# Patient Record
Sex: Male | Born: 1970 | Race: White | Hispanic: No | Smoking: Former smoker
Health system: Southern US, Community
[De-identification: ages and names within clinical notes are randomized; demographics above are authoritative.]

## PROBLEM LIST (undated history)

## (undated) DIAGNOSIS — F329 Major depressive disorder, single episode, unspecified: Secondary | ICD-10-CM

## (undated) DIAGNOSIS — Z87442 Personal history of urinary calculi: Secondary | ICD-10-CM

## (undated) DIAGNOSIS — K219 Gastro-esophageal reflux disease without esophagitis: Secondary | ICD-10-CM

## (undated) DIAGNOSIS — M1990 Unspecified osteoarthritis, unspecified site: Secondary | ICD-10-CM

## (undated) DIAGNOSIS — F32A Depression, unspecified: Secondary | ICD-10-CM

## (undated) DIAGNOSIS — K279 Peptic ulcer, site unspecified, unspecified as acute or chronic, without hemorrhage or perforation: Secondary | ICD-10-CM

## (undated) DIAGNOSIS — G43909 Migraine, unspecified, not intractable, without status migrainosus: Secondary | ICD-10-CM

## (undated) DIAGNOSIS — K589 Irritable bowel syndrome without diarrhea: Secondary | ICD-10-CM

## (undated) DIAGNOSIS — M940 Chondrocostal junction syndrome [Tietze]: Secondary | ICD-10-CM

## (undated) DIAGNOSIS — E785 Hyperlipidemia, unspecified: Secondary | ICD-10-CM

## (undated) DIAGNOSIS — F4310 Post-traumatic stress disorder, unspecified: Secondary | ICD-10-CM

## (undated) DIAGNOSIS — K861 Other chronic pancreatitis: Secondary | ICD-10-CM

## (undated) HISTORY — DX: Migraine, unspecified, not intractable, without status migrainosus: G43.909

## (undated) HISTORY — PX: APPENDECTOMY: SHX54

## (undated) HISTORY — PX: CHOLECYSTECTOMY: SHX55

---
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
## 1998-07-15 HISTORY — PX: KNEE ARTHROSCOPY: SHX127

## 2000-05-08 ENCOUNTER — Emergency Department (HOSPITAL_COMMUNITY): Admission: EM | Admit: 2000-05-08 | Discharge: 2000-05-08 | Payer: Self-pay | Admitting: Emergency Medicine

## 2004-08-05 ENCOUNTER — Emergency Department (HOSPITAL_COMMUNITY): Admission: EM | Admit: 2004-08-05 | Discharge: 2004-08-05 | Payer: Self-pay | Admitting: Emergency Medicine

## 2006-01-29 ENCOUNTER — Encounter: Admission: RE | Admit: 2006-01-29 | Discharge: 2006-01-29 | Payer: Self-pay | Admitting: Gastroenterology

## 2006-01-31 ENCOUNTER — Ambulatory Visit (HOSPITAL_COMMUNITY): Admission: RE | Admit: 2006-01-31 | Discharge: 2006-01-31 | Payer: Self-pay | Admitting: Gastroenterology

## 2006-05-21 ENCOUNTER — Encounter (INDEPENDENT_AMBULATORY_CARE_PROVIDER_SITE_OTHER): Payer: Self-pay | Admitting: Specialist

## 2006-05-21 ENCOUNTER — Inpatient Hospital Stay (HOSPITAL_COMMUNITY): Admission: EM | Admit: 2006-05-21 | Discharge: 2006-05-22 | Payer: Self-pay | Admitting: *Deleted

## 2006-08-19 ENCOUNTER — Emergency Department (HOSPITAL_COMMUNITY): Admission: EM | Admit: 2006-08-19 | Discharge: 2006-08-20 | Payer: Self-pay | Admitting: *Deleted

## 2006-08-26 ENCOUNTER — Ambulatory Visit (HOSPITAL_COMMUNITY): Admission: RE | Admit: 2006-08-26 | Discharge: 2006-08-26 | Payer: Self-pay | Admitting: Internal Medicine

## 2007-03-10 ENCOUNTER — Emergency Department (HOSPITAL_COMMUNITY): Admission: EM | Admit: 2007-03-10 | Discharge: 2007-03-10 | Payer: Self-pay | Admitting: Emergency Medicine
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
## 2009-02-11 ENCOUNTER — Emergency Department (HOSPITAL_COMMUNITY): Admission: EM | Admit: 2009-02-11 | Discharge: 2009-02-11 | Payer: Self-pay | Admitting: Emergency Medicine

## 2009-04-03 ENCOUNTER — Emergency Department (HOSPITAL_COMMUNITY): Admission: EM | Admit: 2009-04-03 | Discharge: 2009-04-03 | Payer: Self-pay | Admitting: Emergency Medicine

## 2009-04-09 ENCOUNTER — Emergency Department (HOSPITAL_COMMUNITY): Admission: EM | Admit: 2009-04-09 | Discharge: 2009-04-09 | Payer: Self-pay | Admitting: Emergency Medicine

## 2009-06-21 ENCOUNTER — Emergency Department (HOSPITAL_COMMUNITY): Admission: EM | Admit: 2009-06-21 | Discharge: 2009-06-21 | Payer: Self-pay | Admitting: Emergency Medicine

## 2009-11-30 ENCOUNTER — Emergency Department (HOSPITAL_COMMUNITY): Admission: EM | Admit: 2009-11-30 | Discharge: 2009-11-30 | Payer: Self-pay | Admitting: Emergency Medicine

## 2010-07-15 DIAGNOSIS — K589 Irritable bowel syndrome without diarrhea: Secondary | ICD-10-CM

## 2010-07-15 HISTORY — DX: Irritable bowel syndrome without diarrhea: K58.9

## 2010-08-05 ENCOUNTER — Encounter: Payer: Self-pay | Admitting: Gastroenterology

## 2010-11-14 ENCOUNTER — Emergency Department (HOSPITAL_COMMUNITY): Payer: Self-pay

## 2010-11-14 ENCOUNTER — Emergency Department (HOSPITAL_COMMUNITY)
Admission: EM | Admit: 2010-11-14 | Discharge: 2010-11-14 | Disposition: A | Discharge status: Home / Self Care | Payer: Self-pay | Attending: Emergency Medicine | Admitting: Emergency Medicine

## 2010-11-14 DIAGNOSIS — R1013 Epigastric pain: Secondary | ICD-10-CM | POA: Insufficient documentation

## 2010-11-14 DIAGNOSIS — R197 Diarrhea, unspecified: Secondary | ICD-10-CM | POA: Insufficient documentation

## 2010-11-14 DIAGNOSIS — Z791 Long term (current) use of non-steroidal anti-inflammatories (NSAID): Secondary | ICD-10-CM | POA: Insufficient documentation

## 2010-11-14 DIAGNOSIS — K625 Hemorrhage of anus and rectum: Secondary | ICD-10-CM | POA: Insufficient documentation

## 2010-11-14 DIAGNOSIS — R10815 Periumbilic abdominal tenderness: Secondary | ICD-10-CM | POA: Insufficient documentation

## 2010-11-14 DIAGNOSIS — G43909 Migraine, unspecified, not intractable, without status migrainosus: Secondary | ICD-10-CM | POA: Insufficient documentation

## 2010-11-14 DIAGNOSIS — R110 Nausea: Secondary | ICD-10-CM | POA: Insufficient documentation

## 2010-11-14 DIAGNOSIS — Z9089 Acquired absence of other organs: Secondary | ICD-10-CM | POA: Insufficient documentation

## 2010-11-14 LAB — BASIC METABOLIC PANEL
CO2: 19 mEq/L (ref 19–32)
Calcium: 9.4 mg/dL (ref 8.4–10.5)
Creatinine, Ser: 0.97 mg/dL (ref 0.4–1.5)
GFR calc Af Amer: >60 mL/min (ref >60)
GFR calc non Af Amer: >60 mL/min (ref >60)

## 2010-11-14 LAB — DIFFERENTIAL
Basophils Relative: 0 % (ref 0–1)
Eosinophils Absolute: 0 10*3/uL (ref 0.0–0.7)
Eosinophils Relative: 1 % (ref 0–5)
Monocytes Absolute: 0.7 10*3/uL (ref 0.1–1.0)
Monocytes Relative: 9 % (ref 3–12)

## 2010-11-14 LAB — CBC
Hemoglobin: 16.9 g/dL (ref 13.0–17.0)
MCH: 29.1 pg (ref 26.0–34.0)
MCHC: 35.9 g/dL (ref 30.0–36.0)
Platelets: 255 10*3/uL (ref 150–400)
RDW: 12.9 % (ref 11.5–15.5)

## 2010-11-14 MED ORDER — IOHEXOL 300 MG/ML  SOLN
100.0000 mL | Freq: Once | INTRAMUSCULAR | Status: AC | PRN
  Administered 2010-11-14: 100 mL via INTRAVENOUS

## 2010-11-22 ENCOUNTER — Other Ambulatory Visit: Payer: Self-pay | Admitting: Gastroenterology
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
## 2010-11-30 NOTE — Discharge Summary (Signed)
NAMEARRICK, DUTTON               ACCOUNT NO.:  [id number]   MEDICAL RECORD NO.:  [id number]          PATIENT TYPE:  INP   LOCATION:  5733                         FACILITY:  [facility]   PHYSICIAN:  Sandria Bales. Ezzard Standing, M.D.  DATE OF BIRTH:  April 01, 1971   DATE OF ADMISSION:  05/21/2006  DATE OF DISCHARGE:  05/22/2006                               DISCHARGE SUMMARY   DISCHARGE DIAGNOSIS:  Acute supparative appendicitis.   OPERATION PERFORMED:  Laparoscopic appendectomy.   INDICATIONS FOR PROCEDURE:  Mr. Chiou is a 40 year old white male seen  at [facility] who developed abdominal pain the evening before  presentation.  He presented with the signs and symptoms of acute  appendicitis, with a white blood count of 17,100.   He was taken to the operating room where he underwent a laparoscopic  appendectomy without incident.   His final pathology showed acute suppurative appendicitis.   He was discharged home on November8, 2007.  He was given Vicodin for  pain.  He can shower.  He was to see me back to in 2 to 3 weeks for  followup.  Call for any interval problem.      Sandria Bales. Ezzard Standing, M.D.  Electronically Signed     DHN/MEDQ  D:  07/24/2006  T:  07/24/2006  Job:  [id number]

## 2010-11-30 NOTE — Op Note (Signed)
NAMERAEF, SPRIGG               ACCOUNT NO.:  [id number]   MEDICAL RECORD NO.:  [id number]          PATIENT TYPE:  INP   LOCATION:  5733                         FACILITY:  [facility]   PHYSICIAN:  Sandria Bales. Ezzard Standing, M.D.  DATE OF BIRTH:  Jul 15, 1971   DATE OF PROCEDURE:  DATE OF DISCHARGE:                                 OPERATIVE REPORT   PREOPERATIVE DIAGNOSIS:  Appendicitis.   POSTOPERATIVE DIAGNOSIS:  Acute appendicitis.   PROCEDURE:  Laparoscopic appendectomy.   SURGEON:  Sandria Bales. Ezzard Standing, M.D.   ASSISTANT:  None.   ANESTHESIA:  General endotracheal.   ESTIMATED BLOOD LOSS:  Minimal.   INDICATIONS FOR PROCEDURE:  The patient is a 40 year old white male who is  taken care of through [facility] but does not know the name of his specific  physician he sees there.  Presented with an approximate 8-hour history of  abdominal pain which is localized to the right lower quadrant.  Has a white  blood count of 17,000 and signs and symptoms consistent with acute  appendicitis.   I discussed with him about proceeding with appendectomy.  I can usually do  this laparoscopically about 80% of the time, but if not I would have to do  an open surgery.  I also discussed the risk that it would not be  appendicitis but another diagnosis such as diverticular disease, etc.  I  have discussed the risks of open surgery, bowel resection, colostomy,  infections.   OPERATIVE NOTE:  The patient was placed in the supine position.  He was  given a gram of cefoxitin at the initiation of the procedure.  His left arm  was tucked, his right arm out to the side.  His Foley catheter in place.  His abdomen was shaved.  Prepped with Betadine solution and sterilely  draped.   I accessed his abdominal cavity through an infraumbilical incision with  sharp dissection.  I placed a 10-mm Hasson trocar through a 0-degree, 10-mm  laparoscope through a 12-mm Hasson trocar.  I secured this with a 0-Vicryl  suture.  I  placed a 5-mm trocar in the right upper quadrant, a 10-mm trocar  in the left lower quadrant.   Abdominal exploration revealed right and left lobes of the liver  unremarkable.  Omentum and bowel that I could see was unremarkable.  The  right lower quadrant had an acutely inflamed purulent appearing appendix  consistent with acute appendicitis.  I took down the mesentery of the  appendix using harmonic scalpel.  For the base of the appendix, I then used  a 45 endo-GIA vascular staple of the Ethicon variety and stapled across the  base of the appendix.  I placed the appendix in an EndoCatch bag and  delivered it through the umbilicus.   I then irrigated the abdomen with about 500 mL of saline.  The staple line  looked good.  There was no bleeding from the mesentery of the appendix.  I  then removed the trocars under direct visualization.  There was no bleeding  at any  trocar site.  The umbilical  port was closed with a 0-Vicryl suture.  The  skin injury site was closed with a 5-0 Vicryl suture, painted with a  tincture of benzoin, and Steri-Stripped.  The patient tolerated the  procedure well.  Sponge and instrument counts were correct at the end of the  case.      Sandria Bales. Ezzard Standing, M.D.  Electronically Signed     DHN/MEDQ  D:  05/21/2006  T:  05/21/2006  Job:  [id number]

## 2011-03-02 ENCOUNTER — Emergency Department (HOSPITAL_COMMUNITY)
Admission: EM | Admit: 2011-03-02 | Discharge: 2011-03-02 | Disposition: A | Discharge status: Home / Self Care | Payer: Self-pay | Attending: Emergency Medicine | Admitting: Emergency Medicine

## 2011-03-02 DIAGNOSIS — R21 Rash and other nonspecific skin eruption: Secondary | ICD-10-CM | POA: Insufficient documentation

## 2012-12-15 ENCOUNTER — Ambulatory Visit: Payer: Self-pay | Admitting: Family Medicine

## 2012-12-15 VITALS — BP 124/72 | HR 66 | Temp 98.3°F | Resp 18 | Ht 71.5 in | Wt 210.0 lb

## 2012-12-15 DIAGNOSIS — G43919 Migraine, unspecified, intractable, without status migrainosus: Secondary | ICD-10-CM

## 2012-12-15 DIAGNOSIS — G43909 Migraine, unspecified, not intractable, without status migrainosus: Secondary | ICD-10-CM

## 2012-12-15 MED ORDER — PROMETHAZINE HCL 25 MG PO TABS: 25.0000 mg | ORAL_TABLET | Freq: Three times a day (TID) | ORAL | Status: DC | PRN

## 2012-12-15 MED ORDER — SUMATRIPTAN SUCCINATE 100 MG PO TABS: 100.0000 mg | ORAL_TABLET | Freq: Every day | ORAL | Status: DC | PRN

## 2012-12-15 NOTE — Patient Instructions (Addendum)
Use the imitrex as needed for migraine, and the phenergan as needed for pain and nausea.  If you do not feel better today please call me or come back in- Sooner if worse.   If this headache seems to be different or worse than your typical migraine please let me know right away

## 2012-12-15 NOTE — Progress Notes (Signed)
[facility] [address] [place] [address] [phone]  Date:  12/15/2012   Name:  Frank Soto   DOB:  11-10-1970   MRN:  [id number]  PCP:  No primary provider on file.    Chief Complaint: No chief complaint on file.   History of Present Illness:  Frank Soto is a 42 y.o. very pleasant male patient who presents with the following:  Here today as a new patient.  He has had migraine HA's since the age of 42 years old per his report.   He is here with a current HA- has been present since early this am.  he gets up at 3am.  A couple of hours later he noted onset of the HA. He took some ibuprofen, and the HA came on as the morning progressed.  He now has a typical migraine HA for him.  He states he last got a migraine HA about 2 months ago.    They tend to occur about once a month.   He had been taking imitrex, but stopped as this no longer seemed to be helping.  However, he might like to try this again Most recently went to Dr. Jearl Klinefelter office and was treated with phenergan according to pharmacy records.   He has noted nausea but no vomiting.  He has photophobia and phonophobia.  He does tend to get aura.   He is otherwise generally healthy  There are no active problems to display for this patient.   Past Medical History  Diagnosis Date  . Migraine     Past Surgical History  Procedure Laterality Date  . Appendectomy    . Cholecystectomy      History  Substance Use Topics  . Smoking status: Never Smoker   . Smokeless tobacco: Not on file  . Alcohol Use: No    Family History  Problem Relation Age of Onset  . COPD Mother     No Known Allergies  Medication list has been reviewed and updated.  No current outpatient prescriptions on file prior to visit.   No current facility-administered medications on file prior to visit.    Review of Systems:  As per HPI- otherwise negative. He states this is a typical migraine HA for  him  Physical Examination: Filed Vitals:   12/15/12 0951  BP: 124/72  Pulse: 66  Temp: 98.3 F (36.8 C)  Resp: 18   Filed Vitals:   12/15/12 0951  Height: 5' 11.5" (1.816 m)  Weight: 210 lb (95.255 kg)   Body mass index is 28.88 kg/(m^2). Ideal Body Weight: Weight in (lb) to have BMI = 25: 181.4  GEN: WDWN, NAD, Non-toxic, A & O x 3 HEENT: Atraumatic, Normocephalic. Neck supple. No masses, No LAD.  PEERL, EOMI Ears and Nose: No external deformity. CV: RRR, No M/G/R. No JVD. No thrill. No extra heart sounds. PULM: CTA B, no wheezes, crackles, rhonchi. No retractions. No resp. distress. No accessory muscle use. ABD: S, NT, ND EXTR: No c/c/e NEURO Normal gait.  Normal UE and LE strength and sensation, normal DTR.   PSYCH: Normally interactive. Conversant. Not depressed or anxious appearing.  Calm demeanor.    Assessment and Plan: Migraine headache - Plan: SUMAtriptan (IMITREX) 100 MG tablet, promethazine (PHENERGAN) 25 MG tablet  Typical migraine HA for his today.  Refilled his imitrex and phenergan.   Advised him to try his medication and rest.  If he does not feel better as  the day goes on he is to call or RTC.    Signed Abbe Amsterdam, MD

## 2013-09-23 ENCOUNTER — Observation Stay (HOSPITAL_COMMUNITY)
Admission: EM | Admit: 2013-09-23 | Discharge: 2013-09-27 | Disposition: A | Discharge status: Home / Self Care | Payer: Self-pay | Attending: Emergency Medicine | Admitting: Emergency Medicine

## 2013-09-23 ENCOUNTER — Encounter (HOSPITAL_COMMUNITY): Payer: Self-pay | Admitting: Emergency Medicine

## 2013-09-23 ENCOUNTER — Emergency Department (HOSPITAL_COMMUNITY): Payer: Self-pay

## 2013-09-23 ENCOUNTER — Other Ambulatory Visit: Payer: Self-pay

## 2013-09-23 DIAGNOSIS — G43909 Migraine, unspecified, not intractable, without status migrainosus: Secondary | ICD-10-CM | POA: Insufficient documentation

## 2013-09-23 DIAGNOSIS — Z79899 Other long term (current) drug therapy: Secondary | ICD-10-CM | POA: Insufficient documentation

## 2013-09-23 DIAGNOSIS — J9819 Other pulmonary collapse: Secondary | ICD-10-CM

## 2013-09-23 DIAGNOSIS — R0602 Shortness of breath: Principal | ICD-10-CM | POA: Insufficient documentation

## 2013-09-23 DIAGNOSIS — J9811 Atelectasis: Secondary | ICD-10-CM

## 2013-09-23 DIAGNOSIS — Z87891 Personal history of nicotine dependence: Secondary | ICD-10-CM | POA: Insufficient documentation

## 2013-09-23 DIAGNOSIS — R55 Syncope and collapse: Secondary | ICD-10-CM

## 2013-09-23 DIAGNOSIS — R0789 Other chest pain: Secondary | ICD-10-CM | POA: Insufficient documentation

## 2013-09-23 DIAGNOSIS — R05 Cough: Secondary | ICD-10-CM | POA: Insufficient documentation

## 2013-09-23 DIAGNOSIS — E785 Hyperlipidemia, unspecified: Secondary | ICD-10-CM | POA: Diagnosis present

## 2013-09-23 DIAGNOSIS — R079 Chest pain, unspecified: Secondary | ICD-10-CM

## 2013-09-23 DIAGNOSIS — R059 Cough, unspecified: Secondary | ICD-10-CM | POA: Insufficient documentation

## 2013-09-23 LAB — CBC
HEMATOCRIT: 46.9 % (ref 39.0–52.0)
HEMOGLOBIN: 17.1 g/dL — AB (ref 13.0–17.0)
MCH: 29.6 pg (ref 26.0–34.0)
MCHC: 36.5 g/dL — AB (ref 30.0–36.0)
MCV: 81.3 fL (ref 78.0–100.0)
Platelets: 279 10*3/uL (ref 150–400)
RBC: 5.77 MIL/uL (ref 4.22–5.81)
RDW: 12.8 % (ref 11.5–15.5)
WBC: 9.9 10*3/uL (ref 4.0–10.5)

## 2013-09-23 LAB — I-STAT TROPONIN, ED: TROPONIN I, POC: 0 ng/mL (ref 0.00–0.08)

## 2013-09-23 LAB — COMPREHENSIVE METABOLIC PANEL
ALK PHOS: 104 U/L (ref 39–117)
ALT: 30 U/L (ref 0–53)
AST: 26 U/L (ref 0–37)
Albumin: 4 g/dL (ref 3.5–5.2)
BILIRUBIN TOTAL: 0.4 mg/dL (ref 0.3–1.2)
BUN: 18 mg/dL (ref 6–23)
CO2: 24 meq/L (ref 19–32)
Calcium: 9.8 mg/dL (ref 8.4–10.5)
Chloride: 100 mEq/L (ref 96–112)
Creatinine, Ser: 1.06 mg/dL (ref 0.50–1.35)
GFR, EST NON AFRICAN AMERICAN: 84 mL/min — AB (ref >90)
GLUCOSE: 97 mg/dL (ref 70–99)
POTASSIUM: 4.5 meq/L (ref 3.7–5.3)
SODIUM: 139 meq/L (ref 137–147)
TOTAL PROTEIN: 7.4 g/dL (ref 6.0–8.3)

## 2013-09-23 LAB — TROPONIN I: Troponin I: <0.3 ng/mL (ref <0.30)

## 2013-09-23 LAB — D-DIMER, QUANTITATIVE: D-Dimer, Quant: <0.27 ug/mL-FEU (ref 0.00–0.48)

## 2013-09-23 MED ORDER — MORPHINE SULFATE 2 MG/ML IJ SOLN
2.0000 mg | INTRAMUSCULAR | Status: DC | PRN
  Filled 2013-09-23: qty 1

## 2013-09-23 MED ORDER — GI COCKTAIL ~~LOC~~: 30.0000 mL | Freq: Four times a day (QID) | ORAL | Status: DC | PRN

## 2013-09-23 MED ORDER — HYDROMORPHONE HCL PF 1 MG/ML IJ SOLN
0.5000 mg | INTRAMUSCULAR | Status: DC | PRN
  Administered 2013-09-24: 1 mg via INTRAVENOUS
  Administered 2013-09-24: 0.5 mg via INTRAVENOUS
  Administered 2013-09-24: 1 mg via INTRAVENOUS
  Filled 2013-09-23 (×2): qty 1

## 2013-09-23 MED ORDER — IOHEXOL 350 MG/ML SOLN
100.0000 mL | Freq: Once | INTRAVENOUS | Status: AC | PRN
  Administered 2013-09-23: 100 mL via INTRAVENOUS

## 2013-09-23 MED ORDER — NITROGLYCERIN 2 % TD OINT
0.5000 [in_us] | TOPICAL_OINTMENT | Freq: Four times a day (QID) | TRANSDERMAL | Status: DC
  Administered 2013-09-24 (×2): 0.5 [in_us] via TOPICAL
  Filled 2013-09-23: qty 30

## 2013-09-23 MED ORDER — NITROGLYCERIN 0.4 MG SL SUBL: 0.4000 mg | SUBLINGUAL_TABLET | SUBLINGUAL | Status: DC | PRN

## 2013-09-23 MED ORDER — ASPIRIN EC 325 MG PO TBEC
325.0000 mg | DELAYED_RELEASE_TABLET | Freq: Every day | ORAL | Status: DC
  Administered 2013-09-24 – 2013-09-26 (×3): 325 mg via ORAL
  Filled 2013-09-23 (×3): qty 1

## 2013-09-23 MED ORDER — MORPHINE SULFATE 2 MG/ML IJ SOLN
2.0000 mg | Freq: Once | INTRAMUSCULAR | Status: AC
Start: 2013-09-23 — End: 2013-09-23
  Administered 2013-09-23: 2 mg via INTRAVENOUS

## 2013-09-23 MED ORDER — ENOXAPARIN SODIUM 40 MG/0.4ML ~~LOC~~ SOLN
40.0000 mg | SUBCUTANEOUS | Status: DC
  Administered 2013-09-23 – 2013-09-26 (×4): 40 mg via SUBCUTANEOUS
  Filled 2013-09-23 (×5): qty 0.4

## 2013-09-23 MED ORDER — ONDANSETRON HCL 4 MG/2ML IJ SOLN
4.0000 mg | Freq: Once | INTRAMUSCULAR | Status: AC
  Administered 2013-09-23: 4 mg via INTRAVENOUS
  Filled 2013-09-23: qty 2

## 2013-09-23 MED ORDER — MORPHINE SULFATE 4 MG/ML IJ SOLN
4.0000 mg | Freq: Once | INTRAMUSCULAR | Status: AC
  Administered 2013-09-23: 4 mg via INTRAVENOUS
  Filled 2013-09-23: qty 1

## 2013-09-23 MED ORDER — NITROPRUSSIDE SODIUM 25 MG/ML IV SOLN
0.2500 ug/kg/min | INTRAVENOUS | Status: DC
  Filled 2013-09-23: qty 2

## 2013-09-23 MED ORDER — LIDOCAINE 5 % EX PTCH
1.0000 | MEDICATED_PATCH | CUTANEOUS | Status: DC
  Administered 2013-09-23 – 2013-09-27 (×4): 1 via TRANSDERMAL
  Filled 2013-09-23 (×5): qty 1

## 2013-09-23 NOTE — ED Notes (Signed)
Dr Allena KatzPatel at bedside to assess patient Notified of patient on going CP.

## 2013-09-23 NOTE — ED Provider Notes (Signed)
CSN: [id number]     Arrival date & time 09/23/13  1637 History   First MD Initiated Contact with Patient 09/23/13 1727     Chief Complaint  Patient presents with  . Chest Pain  . Shortness of Breath     (Consider location/radiation/quality/duration/timing/severity/associated sxs/prior Treatment) Patient is a 43 y.o. male presenting with chest pain and shortness of breath. The history is provided by the patient.  Chest Pain Associated symptoms: cough and shortness of breath   Associated symptoms: no diaphoresis   Shortness of Breath Associated symptoms: chest pain and cough   Associated symptoms: no diaphoresis     Patient here today for chest pain and SOB that started earlier this morning when he was walking across a parking lot. Patient described chest pain as a sharp sensation to his left upper chest, nonradiating, pleuritic, worsening with taking deep breath or cough. Endorse shortness of breath even with mild exertion. Patient states the pain was present all day, but had alleviated some until he picked up a 35 lb pallet at work around 3 PM, at which time the chest pain returned in a worse state than the morning. The pain and SOB has gradually worsened since that time.He states he has never had pain like this before. Admits to cough and nausea that started today with the chest pain. No cardiac history, no hypertension, no hyperlipidemia. Patient has history of rib and sternum fractures and cholecystectomy and appendectomy, otherwise healthy. Denies vomiting, diaphoresis, dysuria, hematochezia, bowel habit changes, and recent illness. No recent surgery, prolonged travel (although he does travels by car a few hours each way on regular basis for work), history of cancer, history of prior PE or DVT, unilateral leg swelling or calf pain. No hemoptysis. No fever or chills. No prior cardiac workup, and no prior stress tests or heart catheterization.  Past Medical History  Diagnosis Date  .  Migraine    Past Surgical History  Procedure Laterality Date  . Appendectomy    . Cholecystectomy     Family History  Problem Relation Age of Onset  . COPD Mother    History  Substance Use Topics  . Smoking status: Former Games developer  . Smokeless tobacco: Not on file  . Alcohol Use: No    Review of Systems  Constitutional: Negative for diaphoresis.  Respiratory: Positive for cough and shortness of breath.   Cardiovascular: Positive for chest pain.  Gastrointestinal: Negative for blood in stool.      Allergies  Review of patient's allergies indicates no known allergies.  Home Medications   Current Outpatient Rx  Name  Route  Sig  Dispense  Refill  . promethazine (PHENERGAN) 25 MG tablet   Oral   Take 1 tablet (25 mg total) by mouth every 8 (eight) hours as needed for nausea. Use for nausea related to migraine HA.   20 tablet   0   . SUMAtriptan (IMITREX) 100 MG tablet   Oral   Take 1 tablet (100 mg total) by mouth daily as needed for migraine.   10 tablet   0    BP 113/86  Pulse 88  Temp(Src) 97.7 F (36.5 C) (Oral)  Resp 22  Ht 6\' 1"  (1.854 m)  Wt 205 lb (92.987 kg)  BMI 27.05 kg/m2  SpO2 97% Physical Exam  Constitutional: He appears well-developed and well-nourished. No distress.  Neck: No JVD present.  Cardiovascular: Normal rate, regular rhythm and normal heart sounds.   Pulmonary/Chest: Effort normal and breath sounds  normal. He has no wheezes. He has no rales. He exhibits tenderness (Diffuse anterior chest wall tenderness on palpation without crepitus, emphysema, or paradoxical chest movement. ).  Musculoskeletal: He exhibits no edema.  Neurological: He is alert.  Skin: Skin is warm and dry. He is not diaphoretic.  Psychiatric: He has a normal mood and affect.    ED Course  Procedures (including critical care time)  6:41 PM\ Patient complain of chest pain and shortness of breath, acute onset. He does not have any significant risk factor for PE.  He has a TIMI score 0, HEART score 0.  Pain is pleuritic, and reproducible on exam. Pain medication and antinausea medication given. Low suspicion for aortic dissection, no obvious signs of infection. No significant trauma is suggestive of rib fractures or internal injury.  Given that pt c/o DOE, and does report short car travel on a regular basis, will obtain D-Dimer.  His CXR is without pna.  Care discussed with Dr. Deretha EmoryZackowski  7:49 PM When she patient is laying in bed his saturation is at 97-98% on room air, however as soon as he ambulates a short distance his sats dropped down to 90% on room air and he was having trouble breathing with ambulation. Given the change of his respiratory status, will obtain chest CT angiogram to rule out PE. Patient will needs admission for further management of his condition. Care discussed with her, provider who will admit patient as appropriate.   Labs Review Labs Reviewed  CBC - Abnormal; Notable for the following:    Hemoglobin 17.1 (*)    MCHC 36.5 (*)    All other components within normal limits  COMPREHENSIVE METABOLIC PANEL - Abnormal; Notable for the following:    GFR calc non Af Amer 84 (*)    All other components within normal limits  D-DIMER, QUANTITATIVE  I-STAT TROPOININ, ED   Imaging Review Dg Chest 2 View  09/23/2013   CLINICAL DATA:  Chest pain  EXAM: CHEST  2 VIEW  COMPARISON:  04/09/2009  FINDINGS: The heart size and mediastinal contours are within normal limits. Both lungs are clear. The visualized skeletal structures are unremarkable.  IMPRESSION: No acute abnormality noted.   Electronically Signed   By: Alcide CleverMark  Lukens M.D.   On: 09/23/2013 17:56     EKG Interpretation None      Date: 09/23/2013  Rate: 84  Rhythm: normal sinus rhythm with sinus arrhythmia  QRS Axis: left  Intervals: normal  ST/T Wave abnormalities: normal  Conduction Disutrbances:none  Narrative Interpretation:   Old EKG Reviewed: unchanged    MDM   Final  diagnoses:  Shortness of breath    BP 132/71  Pulse 99  Temp(Src) 97.6 F (36.4 C) (Oral)  Resp 12  Ht 6' (1.829 m)  Wt 198 lb 14.4 oz (90.22 kg)  BMI 26.97 kg/m2  SpO2 98%  I have reviewed nursing notes and vital signs. I personally reviewed the imaging tests through PACS system  I reviewed available ER/hospitalization records thought the EMR     Fayrene HelperBowie Gayla Benn, PA-C 09/24/13 1505

## 2013-09-23 NOTE — H&P (Signed)
[facility] History and Physical  Patient: Frank Soto  ZOX:[id number]  DOB: June 28, 1971  DOS: the patient was seen and examined on 09/23/2013 PCP: No primary provider on file.  Chief Complaint: Chest pain  HPI: Frank Soto is a 43 y.o. male with Past medical history of migraine. The patient is coming from home. The patient presented with complaints of chest pain. He mentions he was walking across the parking lot today at which time he started having sharp left-sided chest pain which was not radiating felt like stabbing and worsening with breathing associated with dry cough. After that he continues to have shortness of breath on exertion and some mild chest pain. He lift heavy weight and after that he started having further worsening of his chest pain and shortness of breath. At that time he also had an episode of nearly passing out dizziness without any vertigo. He denies any similar symptoms in the past he denies any prior coronary work up. He denies any significant family history of coronary artery disease, recent travel, immobilization, orthopnea, PND, fever, chills. He denies any alcohol abuse, drug abuse  Review of Systems: as mentioned in the history of present illness.  A Comprehensive review of the other systems is negative.  Past Medical History  Diagnosis Date  . Migraine   . Kidney stones    Past Surgical History  Procedure Laterality Date  . Appendectomy    . Cholecystectomy     Social History:  reports that he has quit smoking. He does not have any smokeless tobacco history on file. He reports that he does not drink alcohol or use illicit drugs. Independent for most of his  ADL.  No Known Allergies  Family History  Problem Relation Age of Onset  . COPD Mother     Prior to Admission medications   Medication Sig Start Date End Date Taking? Authorizing Provider  ibuprofen (ADVIL,MOTRIN) 200 MG tablet Take 800 mg by mouth every 6 (six) hours as needed for  headache or mild pain.   Yes Historical Provider, MD  promethazine (PHENERGAN) 25 MG tablet Take 25 mg by mouth every 6 (six) hours as needed for nausea or vomiting.   Yes Historical Provider, MD  SUMAtriptan (IMITREX) 100 MG tablet Take 100 mg by mouth every 2 (two) hours as needed for migraine or headache. May repeat in 2 hours if headache persists or recurs.   Yes Historical Provider, MD    Physical Exam: Filed Vitals:   09/23/13 1917 09/23/13 1930 09/23/13 2000 09/23/13 2130  BP: 128/82 123/82 122/80 118/73  Pulse: 92 76 74 82  Temp:      TempSrc:      Resp: 8 22 13 22   Height:      Weight:      SpO2: 99% 93% 98% 97%    General: Alert, Awake and Oriented to Time, Place and Person. Appear in mild distress Eyes: PERRL ENT: Oral Mucosa clear moist. Neck: no JVD Cardiovascular: S1 and S2 Present, no Murmur, Peripheral Pulses Present Respiratory: Bilateral Air entry equal and Decreased, Clear to Auscultation,  no Crackles,no wheezes Abdomen: Bowel Sound Present, Soft and Non tender Skin: no Rash Extremities: no Pedal edema, no calf tenderness Neurologic: Grossly Unremarkable. Labs on Admission:  CBC:  Recent Labs Lab 09/23/13 1648  WBC 9.9  HGB 17.1*  HCT 46.9  MCV 81.3  PLT 279    CMP     Component Value Date/Time   NA 139 09/23/2013 1648  K 4.5 09/23/2013 1648   CL 100 09/23/2013 1648   CO2 24 09/23/2013 1648   GLUCOSE 97 09/23/2013 1648   BUN 18 09/23/2013 1648   CREATININE 1.06 09/23/2013 1648   CALCIUM 9.8 09/23/2013 1648   PROT 7.4 09/23/2013 1648   ALBUMIN 4.0 09/23/2013 1648   AST 26 09/23/2013 1648   ALT 30 09/23/2013 1648   ALKPHOS 104 09/23/2013 1648   BILITOT 0.4 09/23/2013 1648   GFRNONAA 84* 09/23/2013 1648   GFRAA >90 09/23/2013 1648    No results found for this basename: LIPASE, AMYLASE,  in the last 168 hours No results found for this basename: AMMONIA,  in the last 168 hours  No results found for this basename: CKTOTAL, CKMB, CKMBINDEX, TROPONINI,   in the last 168 hours BNP (last 3 results) No results found for this basename: PROBNP,  in the last 8760 hours  Radiological Exams on Admission: Dg Chest 2 View  09/23/2013   CLINICAL DATA:  Chest pain  EXAM: CHEST  2 VIEW  COMPARISON:  04/09/2009  FINDINGS: The heart size and mediastinal contours are within normal limits. Both lungs are clear. The visualized skeletal structures are unremarkable.  IMPRESSION: No acute abnormality noted.   Electronically Signed   By: Alcide Clever M.D.   On: 09/23/2013 17:56   Ct Angio Chest Pe W/cm &/or Wo Cm  09/23/2013   CLINICAL DATA:  Short of breath.  Chest pain.  EXAM: CT ANGIOGRAPHY CHEST WITH CONTRAST  TECHNIQUE: Multidetector CT imaging of the chest was performed using the standard protocol during bolus administration of intravenous contrast. Multiplanar CT image reconstructions and MIPs were obtained to evaluate the vascular anatomy.  CONTRAST:  OMNIPAQUE IOHEXOL 350 MG/ML SOLN  COMPARISON:  Current chest radiograph.  FINDINGS: No evidence of a pulmonary embolus.  Heart is normal in size and configuration. Great vessels are normal in caliber. No aortic dissection there is a borderline enlarged left para carinal lymph node, most likely reactive. No other prominent or enlarged lymph nodes. No mediastinal or hilar masses. The  Mild lower lung zone and dependent subsegmental atelectasis. Lungs are otherwise clear. No pleural effusion. No pneumothorax.  There is mild deformity of the sternum consistent with an old, healed fracture. No other bony abnormality.  Review of the MIP images confirms the above findings.  IMPRESSION: 1. No evidence of a pulmonary embolus. 2. No acute findings. Mild dependent and lower lung zone subsegmental atelectasis.   Electronically Signed   By: Amie Portland M.D.   On: 09/23/2013 20:29    EKG: Independently reviewed. normal EKG, normal sinus rhythm, nonspecific ST and T waves changes.  Assessment/Plan Principal Problem:   Chest  pain Active Problems:   Shortness of breath   Atelectasis   Pre-syncope   1. Chest pain The patient is presenting with complaints of chest pain. His initial EKG and troponin levels are negative for any acute ischemia. He has undergone a CT scan of his chest which was negative for any pulmonary embolism or significant aortic disruption. At the time of my evaluation he continues to have chest pain without any significant EKG changes and a negative troponin. But the patient become symptomatic with dizziness and hypoxia while ambulating here in the ED. With this the patient will be admitted to the hospital for further workup for observation. Most likely his chest pain is secondary to atelectasis and that would also explain his shortness of breath and hypoxia. He will be monitored on telemetry, I  would follow serial troponins, I would a limited echocardiogram in the morning he did I would start him on incentive spirometry q. one hours. Dilaudid for pain Nitroglycerin as needed for chest pain. N.p.o. after midnight  2. History of migraine No headache at present Last use of sumatriptan in 3 weeks ago Continue to monitor   DVT Prophylaxis: subcutaneous Heparin Nutrition: N.p.o. after midnight  Code Status: Full  Disposition: Admitted to observation in telemetry unit.  Author: Lynden OxfordPranav Claretta Kendra, MD [facility] Pager: [phone] 09/23/2013, 10:13 PM    If 7PM-7AM, please contact night-coverage [email or web address] Password [email or web address]

## 2013-09-23 NOTE — ED Provider Notes (Signed)
2045 - Patient care assumed from Fayrene HelperBowie Tran, PA-C at shift change. Patient is a 43 year old male with a history of hyperlipidemia. He is also a former smoker. He presents today for left-sided chest pain, dyspnea on exertion, and shortness of breath while at rest. Chest pain appreciated to be pleuritic in nature. Heart Score 2. Patient found to be symptomatic with ambulation but oxygen saturations dropping from 98% to 90%. Patient also was very symptomatic while ambulating. CT angio ordered as hx of prolonged travel daily and DOE concerning for PE. CT angio negative for PE today. Plan discussed with Ardelle Parkran, PA-C at shift change includes admission to hospital for further work up and evaluation of symptoms, especially in light of hypoxia while ambulating. Patient has no PCP.  2130 - Dr. Allena KatzPatel to admit. Temp admit orders placed.   Results for orders placed during the hospital encounter of 09/23/13  CBC      Result Value Ref Range   WBC 9.9  4.0 - 10.5 K/uL   RBC 5.77  4.22 - 5.81 MIL/uL   Hemoglobin 17.1 (*) 13.0 - 17.0 g/dL   HCT 19.146.9  47.839.0 - 29.552.0 %   MCV 81.3  78.0 - 100.0 fL   MCH 29.6  26.0 - 34.0 pg   MCHC 36.5 (*) 30.0 - 36.0 g/dL   RDW 62.112.8  30.811.5 - 65.715.5 %   Platelets 279  150 - 400 K/uL  COMPREHENSIVE METABOLIC PANEL      Result Value Ref Range   Sodium 139  137 - 147 mEq/L   Potassium 4.5  3.7 - 5.3 mEq/L   Chloride 100  96 - 112 mEq/L   CO2 24  19 - 32 mEq/L   Glucose, Bld 97  70 - 99 mg/dL   BUN 18  6 - 23 mg/dL   Creatinine, Ser 8.461.06  0.50 - 1.35 mg/dL   Calcium 9.8  8.4 - 96.210.5 mg/dL   Total Protein 7.4  6.0 - 8.3 g/dL   Albumin 4.0  3.5 - 5.2 g/dL   AST 26  0 - 37 U/L   ALT 30  0 - 53 U/L   Alkaline Phosphatase 104  39 - 117 U/L   Total Bilirubin 0.4  0.3 - 1.2 mg/dL   GFR calc non Af Amer 84 (*) >90 mL/min   GFR calc Af Amer >90  >90 mL/min  I-STAT TROPOININ, ED      Result Value Ref Range   Troponin i, poc 0.00  0.00 - 0.08 ng/mL   Comment 3            Dg Chest 2  View  09/23/2013   CLINICAL DATA:  Chest pain  EXAM: CHEST  2 VIEW  COMPARISON:  04/09/2009  FINDINGS: The heart size and mediastinal contours are within normal limits. Both lungs are clear. The visualized skeletal structures are unremarkable.  IMPRESSION: No acute abnormality noted.   Electronically Signed   By: Alcide CleverMark  Lukens M.D.   On: 09/23/2013 17:56   Ct Angio Chest Pe W/cm &/or Wo Cm  09/23/2013   CLINICAL DATA:  Short of breath.  Chest pain.  EXAM: CT ANGIOGRAPHY CHEST WITH CONTRAST  TECHNIQUE: Multidetector CT imaging of the chest was performed using the standard protocol during bolus administration of intravenous contrast. Multiplanar CT image reconstructions and MIPs were obtained to evaluate the vascular anatomy.  CONTRAST:  100mL OMNIPAQUE IOHEXOL 350 MG/ML SOLN  COMPARISON:  Current chest radiograph.  FINDINGS: No evidence of a  pulmonary embolus.  Heart is normal in size and configuration. Great vessels are normal in caliber. No aortic dissection there is a borderline enlarged left para carinal lymph node, most likely reactive. No other prominent or enlarged lymph nodes. No mediastinal or hilar masses. The  Mild lower lung zone and dependent subsegmental atelectasis. Lungs are otherwise clear. No pleural effusion. No pneumothorax.  There is mild deformity of the sternum consistent with an old, healed fracture. No other bony abnormality.  Review of the MIP images confirms the above findings.  IMPRESSION: 1. No evidence of a pulmonary embolus. 2. No acute findings. Mild dependent and lower lung zone subsegmental atelectasis.   Electronically Signed   By: Amie Portland M.D.   On: 09/23/2013 20:29      Antony Madura, PA-C 09/23/13 2131

## 2013-09-23 NOTE — ED Notes (Signed)
Patient transported to CT 

## 2013-09-23 NOTE — ED Notes (Signed)
Patient transported to X-ray 

## 2013-09-23 NOTE — ED Notes (Addendum)
Ambulated on the hallway oxygen saturation before ambulation was 96%/RA, during ambulation oxygen saturation dropped to 90%/RA with patient complain of dizzness and SOB. Pt started wheezing and complain of CP at  8/10 on scale of 1-10.

## 2013-09-23 NOTE — ED Notes (Signed)
Pt c/o L sided chest pain that radiates to epigastric area.  Pain started at 0730 while walking across the parking lot.  Pain diminished and pt continued working.  But at 1500 pt lifted a 35 lb pile of wood and the pain intensified. PT became nausated and sob.

## 2013-09-24 ENCOUNTER — Observation Stay (HOSPITAL_COMMUNITY): Payer: Self-pay

## 2013-09-24 ENCOUNTER — Observation Stay (HOSPITAL_COMMUNITY): Payer: MEDICAID

## 2013-09-24 DIAGNOSIS — R079 Chest pain, unspecified: Secondary | ICD-10-CM

## 2013-09-24 DIAGNOSIS — R072 Precordial pain: Secondary | ICD-10-CM

## 2013-09-24 LAB — CBC WITH DIFFERENTIAL/PLATELET
BASOS ABS: 0 10*3/uL (ref 0.0–0.1)
Basophils Relative: 0 % (ref 0–1)
Eosinophils Absolute: 0.1 10*3/uL (ref 0.0–0.7)
Eosinophils Relative: 1 % (ref 0–5)
HCT: 43.7 % (ref 39.0–52.0)
Hemoglobin: 15.6 g/dL (ref 13.0–17.0)
LYMPHS PCT: 19 % (ref 12–46)
Lymphs Abs: 2.8 10*3/uL (ref 0.7–4.0)
MCH: 29.5 pg (ref 26.0–34.0)
MCHC: 35.7 g/dL (ref 30.0–36.0)
MCV: 82.6 fL (ref 78.0–100.0)
Monocytes Absolute: 1 10*3/uL (ref 0.1–1.0)
Monocytes Relative: 7 % (ref 3–12)
NEUTROS ABS: 10.6 10*3/uL — AB (ref 1.7–7.7)
Neutrophils Relative %: 74 % (ref 43–77)
PLATELETS: 265 10*3/uL (ref 150–400)
RBC: 5.29 MIL/uL (ref 4.22–5.81)
RDW: 12.9 % (ref 11.5–15.5)
WBC: 14.4 10*3/uL — AB (ref 4.0–10.5)

## 2013-09-24 LAB — COMPREHENSIVE METABOLIC PANEL
ALT: 24 U/L (ref 0–53)
AST: 17 U/L (ref 0–37)
Albumin: 3.4 g/dL — ABNORMAL LOW (ref 3.5–5.2)
Alkaline Phosphatase: 92 U/L (ref 39–117)
BILIRUBIN TOTAL: 0.4 mg/dL (ref 0.3–1.2)
BUN: 18 mg/dL (ref 6–23)
CALCIUM: 9.1 mg/dL (ref 8.4–10.5)
CHLORIDE: 99 meq/L (ref 96–112)
CO2: 25 meq/L (ref 19–32)
Creatinine, Ser: 1.17 mg/dL (ref 0.50–1.35)
GFR calc Af Amer: 86 mL/min — ABNORMAL LOW (ref >90)
GFR, EST NON AFRICAN AMERICAN: 74 mL/min — AB (ref >90)
Glucose, Bld: 139 mg/dL — ABNORMAL HIGH (ref 70–99)
Potassium: 4.7 mEq/L (ref 3.7–5.3)
SODIUM: 139 meq/L (ref 137–147)
Total Protein: 6.4 g/dL (ref 6.0–8.3)

## 2013-09-24 LAB — PROTIME-INR
INR: 1.04 (ref 0.00–1.49)
Prothrombin Time: 13.4 seconds (ref 11.6–15.2)

## 2013-09-24 LAB — TROPONIN I
Troponin I: <0.3 ng/mL (ref <0.30)
Troponin I: <0.3 ng/mL (ref <0.30)

## 2013-09-24 MED ORDER — HYDROMORPHONE HCL PF 1 MG/ML IJ SOLN
INTRAMUSCULAR | Status: AC
  Administered 2013-09-24: 1 mg via INTRAVENOUS
  Filled 2013-09-24: qty 1

## 2013-09-24 MED ORDER — MORPHINE SULFATE 4 MG/ML IJ SOLN
4.0000 mg | Freq: Once | INTRAMUSCULAR | Status: AC
Start: 2013-09-24 — End: 2013-09-24
  Administered 2013-09-24: 4 mg via INTRAVENOUS
  Filled 2013-09-24: qty 1

## 2013-09-24 MED ORDER — REGADENOSON 0.4 MG/5ML IV SOLN
0.4000 mg | Freq: Once | INTRAVENOUS | Status: AC
Start: 2013-09-24 — End: 2013-09-24
  Administered 2013-09-24: 0.4 mg via INTRAVENOUS

## 2013-09-24 MED ORDER — TECHNETIUM TC 99M SESTAMIBI GENERIC - CARDIOLITE
10.0000 | Freq: Once | INTRAVENOUS | Status: AC | PRN
  Administered 2013-09-24: 10 via INTRAVENOUS

## 2013-09-24 MED ORDER — ONDANSETRON HCL 4 MG/2ML IJ SOLN
INTRAMUSCULAR | Status: AC
  Filled 2013-09-24: qty 2

## 2013-09-24 MED ORDER — ONDANSETRON HCL 4 MG/2ML IJ SOLN
4.0000 mg | Freq: Four times a day (QID) | INTRAMUSCULAR | Status: DC | PRN
  Administered 2013-09-24 (×3): 4 mg via INTRAVENOUS
  Filled 2013-09-24 (×2): qty 2

## 2013-09-24 MED ORDER — SODIUM CHLORIDE 0.9 % IJ SOLN
80.0000 mg | INTRAVENOUS | Status: AC
  Administered 2013-09-24: 80 mg via INTRAVENOUS
  Filled 2013-09-24: qty 3.2

## 2013-09-24 MED ORDER — TECHNETIUM TC 99M SESTAMIBI GENERIC - CARDIOLITE
30.0000 | Freq: Once | INTRAVENOUS | Status: AC | PRN
  Administered 2013-09-24: 30 via INTRAVENOUS

## 2013-09-24 MED ORDER — REGADENOSON 0.4 MG/5ML IV SOLN
INTRAVENOUS | Status: AC
  Filled 2013-09-24: qty 5

## 2013-09-24 NOTE — Progress Notes (Signed)
[facility] PROGRESS NOTE  Frank Soto ZOX:[id number] DOB: 29-Mar-1971 DOA: 09/23/2013 PCP: No primary provider on file.  Assessment/Plan:  Chest pain - Troponin x3 negative -Echocardiogram unrevealing for patient's continued chest pain -Spoke with Dr. Elease Hashimoto (cardiologist) who had a cancellation for a nuclear medicine stress test, and agreed to allow patient to have this spot. Nuclear medicine stress test pending  Migraine headache -Secondary to nitroglycerin. DC nitroglycerin -Explained to patient sumatriptan contraindicated in patients with possible cardiac ischemia, will treat with morphine and cold compresses     Code Status: Full Family Communication: None Disposition Plan: Resolution of chest pain/completion of workup   Consultants:   Procedures: Nuclear meds stress test pending  Echocardiogram 09/24/2013 - Left ventricle: The cavity size was normal. Wall thickness was normal. T -LVEF=  65%. Wall motion was normal; there were no regional wall motion abnormalities. - Right ventricle: The cavity size was normal. Systolic function was normal.   CT angiogram chest PE protocol 09/23/2013 1. No evidence of a pulmonary embolus.  2. No acute findings. Mild dependent and lower lung zone  subsegmental atelectasis.  CXR 09/23/2013 No acute abnormality noted.   Antibiotics:    HPI/Subjective: Frank Soto is a 43 y.o WM PMHx migraine. HA, Hx exertional chest pain x 2 years has not sought treatment until this admission Patient states he went to work today picked up a piece of wood weighing approximately 30 pounds began to have substernal CP, positive DOE, positive nausea. States but down wood however chest pain did not resolve. Throat himself to [facility] cone at which time had been having approximately 20 minutes of continuous substernal CP.  State was walking across the parking lot today at which time CP increased in intensity, positive radiation across whole  chest. Described as stabbing and worsening with breathing associated with dry cough. After that he continues to have shortness of breath on exertion and some mild chest pain.  At that time he also had an episode of nearly passing out dizziness without any vertigo. He denies any significant family history of coronary artery disease, recent travel, immobilization, orthopnea, PND, fever, chills.  He denies any alcohol abuse, drug abuse. 3/13 Currently complaining of migraine headache secondary to nitro paste. Also states having CP (patient just returned from echocardiogram). Positive mild SOB, radiation across chest. Rated 7/10   Objective: Filed Vitals:   09/23/13 2132 09/23/13 2230 09/23/13 2249 09/24/13 0533  BP:  120/66 116/64 110/66  Pulse:  76 78 88  Temp:   97.7 F (36.5 C) 97.6 F (36.4 C)  TempSrc:   Oral Oral  Resp:  11 12   Height:   6' (1.829 m)   Weight:   90.22 kg (198 lb 14.4 oz) 90.22 kg (198 lb 14.4 oz)  SpO2: 95% 94% 97% 98%    Intake/Output Summary (Last 24 hours) at 09/24/13 1156 Last data filed at 09/24/13 0554  Gross per 24 hour  Intake    222 ml  Output      0 ml  Net    222 ml   Filed Weights   09/23/13 1644 09/23/13 2249 09/24/13 0533  Weight: 92.987 kg (205 lb) 90.22 kg (198 lb 14.4 oz) 90.22 kg (198 lb 14.4 oz)    Exam:   General:  A./O. x4, moderate distress secondary to migraine and CP  Cardiovascular: Regular rhythm and rate, negative murmurs rubs gallops  Respiratory: Clear to auscultation bilateral  Abdomen: Soft, nontender, nondistended, plus bowel sound  Musculoskeletal:  Negative pedal edema   Data Reviewed: Basic Metabolic Panel:  Recent Labs Lab 09/23/13 1648 09/24/13 0406  NA 139 139  K 4.5 4.7  CL 100 99  CO2 24 25  GLUCOSE 97 139*  BUN 18 18  CREATININE 1.06 1.17  CALCIUM 9.8 9.1   Liver Function Tests:  Recent Labs Lab 09/23/13 1648 09/24/13 0406  AST 26 17  ALT 30 24  ALKPHOS 104 92  BILITOT 0.4 0.4  PROT 7.4  6.4  ALBUMIN 4.0 3.4*   No results found for this basename: LIPASE, AMYLASE,  in the last 168 hours No results found for this basename: AMMONIA,  in the last 168 hours CBC:  Recent Labs Lab 09/23/13 1648 09/24/13 0406  WBC 9.9 14.4*  NEUTROABS  --  10.6*  HGB 17.1* 15.6  HCT 46.9 43.7  MCV 81.3 82.6  PLT 279 265   Cardiac Enzymes:  Recent Labs Lab 09/23/13 2130 09/24/13 0406 09/24/13 0900  TROPONINI <0.30 <0.30 <0.30   BNP (last 3 results) No results found for this basename: PROBNP,  in the last 8760 hours CBG: No results found for this basename: GLUCAP,  in the last 168 hours  No results found for this or any previous visit (from the past 240 hour(s)).   Studies: Dg Chest 2 View  09/23/2013   CLINICAL DATA:  Chest pain  EXAM: CHEST  2 VIEW  COMPARISON:  04/09/2009  FINDINGS: The heart size and mediastinal contours are within normal limits. Both lungs are clear. The visualized skeletal structures are unremarkable.  IMPRESSION: No acute abnormality noted.   Electronically Signed   By: Alcide Clever M.D.   On: 09/23/2013 17:56   Ct Angio Chest Pe W/cm &/or Wo Cm  09/23/2013   CLINICAL DATA:  Short of breath.  Chest pain.  EXAM: CT ANGIOGRAPHY CHEST WITH CONTRAST  TECHNIQUE: Multidetector CT imaging of the chest was performed using the standard protocol during bolus administration of intravenous contrast. Multiplanar CT image reconstructions and MIPs were obtained to evaluate the vascular anatomy.  CONTRAST:  OMNIPAQUE IOHEXOL 350 MG/ML SOLN  COMPARISON:  Current chest radiograph.  FINDINGS: No evidence of a pulmonary embolus.  Heart is normal in size and configuration. Great vessels are normal in caliber. No aortic dissection there is a borderline enlarged left para carinal lymph node, most likely reactive. No other prominent or enlarged lymph nodes. No mediastinal or hilar masses. The  Mild lower lung zone and dependent subsegmental atelectasis. Lungs are otherwise clear.  No pleural effusion. No pneumothorax.  There is mild deformity of the sternum consistent with an old, healed fracture. No other bony abnormality.  Review of the MIP images confirms the above findings.  IMPRESSION: 1. No evidence of a pulmonary embolus. 2. No acute findings. Mild dependent and lower lung zone subsegmental atelectasis.   Electronically Signed   By: Amie Portland M.D.   On: 09/23/2013 20:29    Scheduled Meds: . aspirin EC  325 mg Oral Daily  . enoxaparin (LOVENOX) injection  40 mg Subcutaneous Q24H  . lidocaine  1 patch Transdermal Q24H  . nitroGLYCERIN  0.5 inch Topical 4 times per day   Continuous Infusions:   Principal Problem:   Chest pain Active Problems:   Migraine   Shortness of breath   Atelectasis   Pre-syncope    Time spent: 45 minute   WOODS, CURTIS, J  [facility] Pager [phone]. If 7PM-7AM, please contact night-coverage at [email or web address], password [facility] 09/24/2013, 11:56  AM  LOS: 1 day

## 2013-09-24 NOTE — Progress Notes (Signed)
Dr. Joseph ArtWoods had discussed case with Dr. Elease HashimotoNahser to clear for stress test - Dr. Joseph ArtWoods ordered nuclear stress test. Pt declined to walk on treadmill due to migraine and feeling tired. Lexiscan cardiolite completed. Pt with baseline chest pressure. During test had increased chest pressure, nausea, SOB, increased HR, headache, abdominal cramping (all common sx). Sx still present at 9 min so aminophylline given for reversal. Pt c/o "heart fluttering" but was NSR on telemetry. Symptoms improved. Chest pressure returned to baseline. No acute EKG changes throughout test. Await results. Dr. Delton SeeNelson will read and report should be back in a few hours. IM to follow up. Aemilia Dedrick PA-C

## 2013-09-24 NOTE — Progress Notes (Signed)
UR completed 

## 2013-09-24 NOTE — Progress Notes (Signed)
C/o HA and nausea medicated as per orders

## 2013-09-25 LAB — COMPREHENSIVE METABOLIC PANEL
ALBUMIN: 3.7 g/dL (ref 3.5–5.2)
ALT: 21 U/L (ref 0–53)
AST: 23 U/L (ref 0–37)
Alkaline Phosphatase: 100 U/L (ref 39–117)
BUN: 12 mg/dL (ref 6–23)
CO2: 28 meq/L (ref 19–32)
Calcium: 9.6 mg/dL (ref 8.4–10.5)
Chloride: 100 mEq/L (ref 96–112)
Creatinine, Ser: 1.01 mg/dL (ref 0.50–1.35)
GFR calc Af Amer: >90 mL/min (ref >90)
GFR calc non Af Amer: 89 mL/min — ABNORMAL LOW (ref >90)
Glucose, Bld: 106 mg/dL — ABNORMAL HIGH (ref 70–99)
Potassium: 5.1 mEq/L (ref 3.7–5.3)
SODIUM: 140 meq/L (ref 137–147)
TOTAL PROTEIN: 7.1 g/dL (ref 6.0–8.3)
Total Bilirubin: 0.7 mg/dL (ref 0.3–1.2)

## 2013-09-25 LAB — CBC WITH DIFFERENTIAL/PLATELET
Basophils Absolute: 0 10*3/uL (ref 0.0–0.1)
Basophils Relative: 0 % (ref 0–1)
Eosinophils Absolute: 0 10*3/uL (ref 0.0–0.7)
Eosinophils Relative: 0 % (ref 0–5)
HCT: 46.7 % (ref 39.0–52.0)
HEMOGLOBIN: 16.8 g/dL (ref 13.0–17.0)
LYMPHS ABS: 3.2 10*3/uL (ref 0.7–4.0)
Lymphocytes Relative: 36 % (ref 12–46)
MCH: 30.1 pg (ref 26.0–34.0)
MCHC: 36 g/dL (ref 30.0–36.0)
MCV: 83.5 fL (ref 78.0–100.0)
MONOS PCT: 7 % (ref 3–12)
Monocytes Absolute: 0.6 10*3/uL (ref 0.1–1.0)
Neutro Abs: 5 10*3/uL (ref 1.7–7.7)
Neutrophils Relative %: 57 % (ref 43–77)
Platelets: 295 10*3/uL (ref 150–400)
RBC: 5.59 MIL/uL (ref 4.22–5.81)
RDW: 12.7 % (ref 11.5–15.5)
WBC: 8.9 10*3/uL (ref 4.0–10.5)

## 2013-09-25 LAB — MAGNESIUM: Magnesium: 2.3 mg/dL (ref 1.5–2.5)

## 2013-09-25 MED ORDER — DIAZEPAM 5 MG PO TABS: 5.0000 mg | ORAL_TABLET | ORAL | Status: DC

## 2013-09-25 MED ORDER — SODIUM CHLORIDE 0.9 % IV SOLN: 250.0000 mL | INTRAVENOUS | Status: DC | PRN

## 2013-09-25 MED ORDER — SODIUM CHLORIDE 0.9 % IJ SOLN: 3.0000 mL | INTRAMUSCULAR | Status: DC | PRN

## 2013-09-25 MED ORDER — SODIUM CHLORIDE 0.9 % IJ SOLN
3.0000 mL | Freq: Two times a day (BID) | INTRAMUSCULAR | Status: DC
  Administered 2013-09-25 – 2013-09-26 (×4): 3 mL via INTRAVENOUS

## 2013-09-25 MED ORDER — ASPIRIN 81 MG PO CHEW
81.0000 mg | CHEWABLE_TABLET | ORAL | Status: AC
  Administered 2013-09-27: 81 mg via ORAL
  Filled 2013-09-25: qty 1

## 2013-09-25 NOTE — Progress Notes (Signed)
PT continues to c/o pain/tightness on his chest area. Pain increases when patient is flat in bed or sitting upright at 90 degrees. Pt does say he feels better at 45 degree angle. His pain goes to 6/10 laying or flat in bed and it decreases to 2/10 at 45 degree angle. Pt also c/o sharp chest pain on deep inspiration. Breathing is guarded.

## 2013-09-25 NOTE — Progress Notes (Signed)
[facility] PROGRESS NOTE  Frank Soto WJX:[id number] DOB: 1971-04-24 DOA: 09/23/2013 PCP: No primary provider on file.  Assessment/Plan:  Chest pain - Troponin x3 negative -Echocardiogram unrevealing for patient's continued chest pain -3/13 EKG showed sinus arrhythmia with LAD -Spoke with Dr. Elease Hashimoto (cardiologist) who had a cancellation for a nuclear medicine stress test, and agreed to allow patient to have this spot. Nuclear medicine stress test pending -Nuclear medicine stress test showed reversible defect in LAD; see results below -Dr. Huston Foley (cardiology) will see patient to determine if cardiac catheterization required  Migraine headache -Secondary to nitroglycerin. DC nitroglycerin -Explained to patient sumatriptan contraindicated in patients with cardiac ischemia - treat with morphine and cold compresses     Code Status: Full Family Communication: None Disposition Plan: Resolution of chest pain/completion of workup   Consultants: Dr. Huston Foley (cardiology)   Procedures: Nuclear meds stress test 09/24/2013 Intermediate risk scan with small size mild reversible defect in the  mid -distal LAD territory (SDS 3). Preserved LVEF and no wall motion  abnormalities.  Echocardiogram 09/24/2013 - Left ventricle: The cavity size was normal. Wall thickness was normal. T -LVEF=  65%. Wall motion was normal; there were no regional wall motion abnormalities. - Right ventricle: The cavity size was normal. Systolic function was normal.  CT angiogram chest PE protocol 09/23/2013 1. No evidence of a pulmonary embolus.  2. No acute findings. Mild dependent and lower lung zone  subsegmental atelectasis.  CXR 09/23/2013 No acute abnormality noted.   Antibiotics:    HPI/Subjective: Frank Soto is a 43 y.o WM PMHx migraine. HA, Hx exertional chest pain x 2 years has not sought treatment until this admission Patient states he went to work today picked up a piece of  wood weighing approximately 30 pounds began to have substernal CP, positive DOE, positive nausea. States but down wood however chest pain did not resolve. Throat himself to [facility] cone at which time had been having approximately 20 minutes of continuous substernal CP.  State was walking across the parking lot today at which time CP increased in intensity, positive radiation across whole chest. Described as stabbing and worsening with breathing associated with dry cough. After that he continues to have shortness of breath on exertion and some mild chest pain.  At that time he also had an episode of nearly passing out dizziness without any vertigo. He denies any significant family history of coronary artery disease, recent travel, immobilization, orthopnea, PND, fever, chills.  He denies any alcohol abuse, drug abuse. 3/13 Currently complaining of migraine headache secondary to nitro paste. Also states having CP (patient just returned from echocardiogram). Positive mild SOB, radiation across chest. Rated 7/10. 3/14 patient states still having some residual chest pain rated at 2/10, described as dull, negative radiation, negative SOB, negative N./V., negative diaphoresis.   Objective: Filed Vitals:   09/24/13 1430 09/24/13 1433 09/24/13 2153 09/25/13 0448  BP: 131/72 132/71 120/80 116/73  Pulse: 107 99 89 85  Temp:   98 F (36.7 C) 98.3 F (36.8 C)  TempSrc:   Oral Oral  Resp:   18 18  Height:      Weight:      SpO2:   99% 97%   No intake or output data in the 24 hours ending 09/25/13 1022 Filed Weights   09/23/13 1644 09/23/13 2249 09/24/13 0533  Weight: 92.987 kg (205 lb) 90.22 kg (198 lb 14.4 oz) 90.22 kg (198 lb 14.4 oz)    Exam:  General:  A./O. x4, mild distress secondary to mild CP. Migraine has resolved  Cardiovascular: Regular rhythm and rate, negative murmurs rubs gallops  Respiratory: Clear to auscultation bilateral  Abdomen: Soft, nontender, nondistended, plus bowel  sound  Musculoskeletal: Negative pedal edema   Data Reviewed: Basic Metabolic Panel:  Recent Labs Lab 09/23/13 1648 09/24/13 0406  NA 139 139  K 4.5 4.7  CL 100 99  CO2 24 25  GLUCOSE 97 139*  BUN 18 18  CREATININE 1.06 1.17  CALCIUM 9.8 9.1   Liver Function Tests:  Recent Labs Lab 09/23/13 1648 09/24/13 0406  AST 26 17  ALT 30 24  ALKPHOS 104 92  BILITOT 0.4 0.4  PROT 7.4 6.4  ALBUMIN 4.0 3.4*   No results found for this basename: LIPASE, AMYLASE,  in the last 168 hours No results found for this basename: AMMONIA,  in the last 168 hours CBC:  Recent Labs Lab 09/23/13 1648 09/24/13 0406  WBC 9.9 14.4*  NEUTROABS  --  10.6*  HGB 17.1* 15.6  HCT 46.9 43.7  MCV 81.3 82.6  PLT 279 265   Cardiac Enzymes:  Recent Labs Lab 09/23/13 2130 09/24/13 0406 09/24/13 0900  TROPONINI <0.30 <0.30 <0.30   BNP (last 3 results) No results found for this basename: PROBNP,  in the last 8760 hours CBG: No results found for this basename: GLUCAP,  in the last 168 hours  No results found for this or any previous visit (from the past 240 hour(s)).   Studies: Dg Chest 2 View  09/23/2013   CLINICAL DATA:  Chest pain  EXAM: CHEST  2 VIEW  COMPARISON:  04/09/2009  FINDINGS: The heart size and mediastinal contours are within normal limits. Both lungs are clear. The visualized skeletal structures are unremarkable.  IMPRESSION: No acute abnormality noted.   Electronically Signed   By: Frank CleverMark  Lukens M.D.   On: 09/23/2013 17:56   Ct Angio Chest Pe W/cm &/or Wo Cm  09/23/2013   CLINICAL DATA:  Short of breath.  Chest pain.  EXAM: CT ANGIOGRAPHY CHEST WITH CONTRAST  TECHNIQUE: Multidetector CT imaging of the chest was performed using the standard protocol during bolus administration of intravenous contrast. Multiplanar CT image reconstructions and MIPs were obtained to evaluate the vascular anatomy.  CONTRAST:  100mL OMNIPAQUE IOHEXOL 350 MG/ML SOLN  COMPARISON:  Current chest  radiograph.  FINDINGS: No evidence of a pulmonary embolus.  Heart is normal in size and configuration. Great vessels are normal in caliber. No aortic dissection there is a borderline enlarged left para carinal lymph node, most likely reactive. No other prominent or enlarged lymph nodes. No mediastinal or hilar masses. The  Mild lower lung zone and dependent subsegmental atelectasis. Lungs are otherwise clear. No pleural effusion. No pneumothorax.  There is mild deformity of the sternum consistent with an old, healed fracture. No other bony abnormality.  Review of the MIP images confirms the above findings.  IMPRESSION: 1. No evidence of a pulmonary embolus. 2. No acute findings. Mild dependent and lower lung zone subsegmental atelectasis.   Electronically Signed   By: Amie Portlandavid  Ormond M.D.   On: 09/23/2013 20:29   Nm Myocar Multi W/spect W/wall Motion / Ef  09/24/2013   CLINICAL DATA:  Chest pain  EXAM: Lexiscan Myovue  TECHNIQUE: The patient received IV Lexiscan .4mg  over 15 seconds. 33.0 mCi of Technetium 3053m Sestamibi injected at 30 seconds. Quantitative SPECT images were obtained in the vertical, horizontal and short axis planes after a  45 minute delay. Rest images were obtained with similar planes and delay using 10.2 mCi of Technetium 70m Sestamibi.  FINDINGS: ECG:  SR, normal ECG, unchanged at stress  Symptoms:  SOB, abdominal cramping  RAW Data: There is significant subdiaphragmatic activity not affecting ability to read. LHR 0.35  Quantitiative Gated SPECT EF:  LVEF 65%, no wall motion abnormality.  Perfusion Images: There is small size mild severity reversible perfusion defect in the mid and apical anterior wall. (SDS 3).  IMPRESSION: Intermediate risk scan with small size mild reversible defect in the mid -distal LAD territory (SDS 3). Preserved LVEF and no wall motion abnormalities.  Frank Soto   Electronically Signed   By: Frank Soto   On: 09/24/2013 18:52    Scheduled Meds: . aspirin EC   325 mg Oral Daily  . enoxaparin (LOVENOX) injection  40 mg Subcutaneous Q24H  . lidocaine  1 patch Transdermal Q24H  . nitroGLYCERIN  0.5 inch Topical 4 times per day   Continuous Infusions:   Principal Problem:   Chest pain Active Problems:   Migraine   Shortness of breath   Atelectasis   Pre-syncope    Time spent: 35 minute   Deantre Bourdon, J  [facility] Pager [phone]. If 7PM-7AM, please contact night-coverage at [email or web address], password [facility] 09/25/2013, 10:22 AM  LOS: 2 days

## 2013-09-25 NOTE — Consult Note (Signed)
CARDIOLOGY CONSULT NOTE   Patient ID: Frank CambridgeBuddy W Regino MRN: [id number] DOB/AGE: 45/08/1970 45 y.o.  Admit Date: 09/23/2013 Referring Physician: PTH  Primary Physician: No primary provider on file. Consulting Cardiologist: Dietrich Patesoss, Paula MD Primary Cardiologist: New Reason for Consultation: Abnormal Stress Test  Clinical Summary Mr. Duffy RhodyStanley is a 43 y.o.male with no prior cardiac history, but history of migraines and nephrolithiasis; hypercholesterolemia, but not on meds; admitted withleft sided chest pain, stabbing pain worsening with breathing and associated with coughing. It is also exacerbated with heavy lifting. He also had one episode of near syncope. The patient states over the last several months he has been feeling very fatigued. He is normally very active working in [employer]. Is able to run and carry heavy objects, however, he states he is unable to do this now has become very tired.   A "curb side consultation"  was completed with Dr.Nahser with Dr. Maple HudsonYoung, Hospitalist, with recommendations for cardiac stress test. This was completed on 09/24/2013, and revealed intermediate risk and with a small cyanosis mild reversible defect in the mid to distal LAD territory, preserved LVEF with no wall motion abnormalities. Compared to stress test results the patient's symptoms, we are asked to make further recommendations.   Other history of note, is that the patient did have some GI bleeding approximately 2 years ago. He states that he had an EGD and colonoscopy which did not reveal any abnormalities. This is completed as an outpatient via [facility]. Other history includes remote use of cocaine, marijuana, alcohol, but has been sober since 2003.    He is currently pain free, denies any palpitations dyspnea on exertion or dizziness. He has been*day in the bed however has not been up walking around.   EKG reveals normal sinus rhythm with sinus arrhythmia with left axis deviation.  Troponin is negative x3.  No Known Allergies  Medications Scheduled Medications: . aspirin EC  325 mg Oral Daily  . enoxaparin (LOVENOX) injection  40 mg Subcutaneous Q24H  . lidocaine  1 patch Transdermal Q24H  . nitroGLYCERIN  0.5 inch Topical 4 times per day    Infusions:    PRN Medications: gi cocktail, HYDROmorphone (DILAUDID) injection, nitroGLYCERIN, ondansetron (ZOFRAN) IV   Past Medical History  Diagnosis Date  . Migraine   . Kidney stones     Past Surgical History  Procedure Laterality Date  . Appendectomy    . Cholecystectomy      Family History  Problem Relation Age of Onset  . COPD Mother     Social History Mr. Duffy RhodyStanley reports that he has quit smoking. He does not have any smokeless tobacco history on file. Mr. Duffy RhodyStanley reports that he does not drink alcohol.  Review of Systems Otherwise reviewed and negative except as outlined.  Physical Examination Blood pressure 116/73, pulse 85, temperature 98.3 F (36.8 C), temperature source Oral, resp. rate 18, height 6' (1.829 m), weight 198 lb 14.4 oz (90.22 kg), SpO2 97.00%. No intake or output data in the 24 hours ending 09/25/13 1102  Telemetry: NSR  HEENT: Conjunctiva and lids normal, oropharynx clear with moist mucosa. Neck: Supple, no elevated JVP or carotid bruits, no thyromegaly. Lungs: Clear to auscultation, nonlabored breathing at rest. Cardiac: Regular rate and rhythm, no S3 or significant systolic murmur, no pericardial rub. Abdomen: Soft, nontender, no hepatomegaly, bowel sounds present, no guarding or rebound. Extremities: No pitting edema, distal pulses 2+. Skin: Warm and dry. Musculoskeletal: No kyphosis. Neuropsychiatric: Alert and oriented x3, affect grossly  appropriate.  Prior Cardiac Testing/Procedures 1. NM stress test 09/25/2103 Intermediate risk scan with small size mild reversible defect in the mid -distal LAD territory (SDS 3). Preserved LVEF and no wall  motion abnormalities.   Lab Results  Basic Metabolic Panel:  Recent Labs Lab 09/23/13 1648 09/24/13 0406  NA 139 139  K 4.5 4.7  CL 100 99  CO2 24 25  GLUCOSE 97 139*  BUN 18 18  CREATININE 1.06 1.17  CALCIUM 9.8 9.1    Liver Function Tests:  Recent Labs Lab 09/23/13 1648 09/24/13 0406  AST 26 17  ALT 30 24  ALKPHOS 104 92  BILITOT 0.4 0.4  PROT 7.4 6.4  ALBUMIN 4.0 3.4*    CBC:  Recent Labs Lab 09/23/13 1648 09/24/13 0406  WBC 9.9 14.4*  NEUTROABS  --  10.6*  HGB 17.1* 15.6  HCT 46.9 43.7  MCV 81.3 82.6  PLT 279 265    Cardiac Enzymes:  Recent Labs Lab 09/23/13 2130 09/24/13 0406 09/24/13 0900  TROPONINI <0.30 <0.30 <0.30    Radiology: Dg Chest 2 View  09/23/2013   CLINICAL DATA:  Chest pain  EXAM: CHEST  2 VIEW  COMPARISON:  04/09/2009  FINDINGS: The heart size and mediastinal contours are within normal limits. Both lungs are clear. The visualized skeletal structures are unremarkable.  IMPRESSION: No acute abnormality noted.   Electronically Signed   By: Alcide Clever M.D.   On: 09/23/2013 17:56   Ct Angio Chest Pe W/cm &/or Wo Cm  09/23/2013   CLINICAL DATA:  Short of breath.  Chest pain.  EXAM: CT ANGIOGRAPHY CHEST WITH CONTRAST  TECHNIQUE: Multidetector CT imaging of the chest was performed using the standard protocol during bolus administration of intravenous contrast. Multiplanar CT image reconstructions and MIPs were obtained to evaluate the vascular anatomy.  CONTRAST:  OMNIPAQUE IOHEXOL 350 MG/ML SOLN  COMPARISON:  Current chest radiograph.  FINDINGS: No evidence of a pulmonary embolus.  Heart is normal in size and configuration. Great vessels are normal in caliber. No aortic dissection there is a borderline enlarged left para carinal lymph node, most likely reactive. No other prominent or enlarged lymph nodes. No mediastinal or hilar masses. The  Mild lower lung zone and dependent subsegmental atelectasis. Lungs are otherwise clear. No  pleural effusion. No pneumothorax.  There is mild deformity of the sternum consistent with an old, healed fracture. No other bony abnormality.  Review of the MIP images confirms the above findings.  IMPRESSION: 1. No evidence of a pulmonary embolus. 2. No acute findings. Mild dependent and lower lung zone subsegmental atelectasis.   Electronically Signed   By: Amie Portland M.D.   On: 09/23/2013 20:29   Nm Myocar Multi W/spect W/wall Motion / Ef  09/24/2013   CLINICAL DATA:  Chest pain  EXAM: Lexiscan Myovue  TECHNIQUE: The patient received IV Lexiscan .4mg  over 15 seconds. 33.0 mCi of Technetium 63m Sestamibi injected at 30 seconds. Quantitative SPECT images were obtained in the vertical, horizontal and short axis planes after a 45 minute delay. Rest images were obtained with similar planes and delay using 10.2 mCi of Technetium 28m Sestamibi.  FINDINGS: ECG:  SR, normal ECG, unchanged at stress  Symptoms:  SOB, abdominal cramping  RAW Data: There is significant subdiaphragmatic activity not affecting ability to read. LHR 0.35  Quantitiative Gated SPECT EF:  LVEF 65%, no wall motion abnormality.  Perfusion Images: There is small size mild severity reversible perfusion defect in the mid and  apical anterior wall. (SDS 3).  IMPRESSION: Intermediate risk scan with small size mild reversible defect in the mid -distal LAD territory (SDS 3). Preserved LVEF and no wall motion abnormalities.  [name]   Electronically Signed   By: [name]   On: 09/24/2013 18:52     ECG: NSR with sinus arrythmia.   Impression and Recommendations  1. Abnormal Stress test with CP: Patient admitted with recurrent left-sided chest pain, dyspnea on exertion, weakness, and had a stress Cardiolite completed which was found to be abnormal in the  LAD territory. I discussed with the patient the need to proceed with cardiac catheterization, risks  benefits and procedure were explained. He verbalizes understanding and is  willing to proceed with catheterization, which is tentatively planned for Monday, March 16th.  He was placed on a heart healthy diet, fasting lipids and LFTs will be ordered. He will continue on aspirin, nitroglycerin sublingual as needed. Should he have recurrent chest pain, unstable angina, may consider having cardiac catheterization completed sooner. Blood pressure is stable at 116/73 with a heart rate of 85. We will not add nitroglycerin paste at this time, he will continue low molecular weight heparin.  Of note, there is a concern with history of GI bleed in the past. Patient states that he had colonoscopy and EDG which were negative. I will request [facility] for documentation of findings, which will affect treatment regimen should he need PCI.  2. Hx of Hypercholesterolemia: Check lipids and LFT's in am.     Signed: Bettey Mare. Lyman Bishop NP [facility] 09/25/2013, 11:02 AM  Patient seen and examined  Agree with findings as noted by Harriet Pho above  I have amended to reflect my findings.    Dietrich Pates

## 2013-09-25 NOTE — Progress Notes (Signed)
Utilization review completed.  P.J. Devine Dant,RN,BSN Case Manager [phone]  

## 2013-09-25 NOTE — ED Provider Notes (Signed)
Medical screening examination/treatment/procedure(s) were performed by non-physician practitioner and as supervising physician I was immediately available for consultation/collaboration.   EKG Interpretation   Date/Time:  Thursday September 23 2013 16:42:49 EDT Ventricular Rate:  84 PR Interval:  140 QRS Duration: 94 QT Interval:  352 QTC Calculation: 415 R Axis:   -31 Text Interpretation:  Normal sinus rhythm with sinus arrhythmia Left axis  deviation Abnormal ECG ED PHYSICIAN INTERPRETATION AVAILABLE IN [facility] Confirmed by TEST, Record ([id number]) on 09/25/2013 9:15:05 AM        Joya Gaskinsonald W Corde Antonini, MD 09/25/13 1921

## 2013-09-26 DIAGNOSIS — E785 Hyperlipidemia, unspecified: Secondary | ICD-10-CM | POA: Diagnosis present

## 2013-09-26 LAB — LIPID PANEL
CHOL/HDL RATIO: 5.3 ratio
Cholesterol: 219 mg/dL — ABNORMAL HIGH (ref 0–200)
HDL: 41 mg/dL (ref >39)
LDL CALC: 142 mg/dL — AB (ref 0–99)
TRIGLYCERIDES: 181 mg/dL — AB (ref <150)
VLDL: 36 mg/dL (ref 0–40)

## 2013-09-26 LAB — PROTIME-INR
INR: 0.96 (ref 0.00–1.49)
Prothrombin Time: 12.6 seconds (ref 11.6–15.2)

## 2013-09-26 MED ORDER — ATORVASTATIN CALCIUM 40 MG PO TABS
40.0000 mg | ORAL_TABLET | Freq: Every day | ORAL | Status: DC
  Administered 2013-09-26 – 2013-09-27 (×2): 40 mg via ORAL
  Filled 2013-09-26 (×2): qty 1

## 2013-09-26 MED ORDER — HYDROCODONE-ACETAMINOPHEN 5-325 MG PO TABS: 1.0000 | ORAL_TABLET | ORAL | Status: DC | PRN

## 2013-09-26 NOTE — Progress Notes (Signed)
[facility] PROGRESS NOTE  Frank Soto ZOX:[id number] DOB: 04/27/1971 DOA: 09/23/2013 PCP: No primary provider on file.  Assessment/Plan:  Chest pain - Troponin x3 negative -Echocardiogram unrevealing for patient's continued chest pain -3/13 EKG showed sinus arrhythmia with LAD -Spoke with Dr. Elease HashimotoNahser (cardiologist) who had a cancellation for a nuclear medicine stress test, and agreed to allow patient to have this spot. Nuclear medicine stress test pending -Nuclear medicine stress test showed reversible defect in LAD; see results below -Dr. Dietrich PatesPaula Ross (cardiology) saw patient on 3/14 and will perform cardiac catheterization on 3/16  Migraine headache -Secondary to nitroglycerin. DC nitroglycerin -Explained to patient sumatriptan contraindicated in patients with cardiac ischemia - treat with morphine and cold compresses  HLD -Start Lipitor 40 mg daily -Goal LDL100mg /dL     Code Status: Full Family Communication: None Disposition Plan: Resolution of chest pain/completion of workup   Consultants: Dr. Dietrich PatesPaula Ross (cardiology)   Procedures: Nuclear meds stress test 09/24/2013 Intermediate risk scan with small size mild reversible defect in the  mid -distal LAD territory (SDS 3). Preserved LVEF and no wall motion  abnormalities.  Echocardiogram 09/24/2013 - Left ventricle: The cavity size was normal. Wall thickness was normal. T -LVEF=  65%. Wall motion was normal; there were no regional wall motion abnormalities. - Right ventricle: The cavity size was normal. Systolic function was normal.  CT angiogram chest PE protocol 09/23/2013 1. No evidence of a pulmonary embolus.  2. No acute findings. Mild dependent and lower lung zone  subsegmental atelectasis.  CXR 09/23/2013 No acute abnormality noted.   Antibiotics:    HPI/Subjective: Frank Soto is a 43 y.o WM PMHx migraine. HA, Hx exertional chest pain x 2 years has not sought treatment until this  admission Patient states he went to work today picked up a piece of wood weighing approximately 30 pounds began to have substernal CP, positive DOE, positive nausea. States but down wood however chest pain did not resolve. [facility] which time had been having approximately 20 minutes of continuous substernal CP.  State was walking across the parking lot today at which time CP increased in intensity, positive radiation across whole chest. Described as stabbing and worsening with breathing associated with dry cough. After that he continues to have shortness of breath on exertion and some mild chest pain.  At that time he also had an episode of nearly passing out dizziness without any vertigo. He denies any significant family history of coronary artery disease, recent travel, immobilization, orthopnea, PND, fever, chills.  He denies any alcohol abuse, drug abuse. 3/13 Currently complaining of migraine headache secondary to nitro paste. Also states having CP (patient just returned from echocardiogram). Positive mild SOB, radiation across chest. Rated 7/10. 3/14 patient states still having some residual chest pain rated at 2/10, described as dull, negative radiation, negative SOB, negative N./V., negative diaphoresis. 3/15 states had one small episode of mild CP overnight., Still has sternal CP to palpation but improved. States migraine headache has almost fully resolved.   Objective: Filed Vitals:   09/25/13 1346 09/25/13 1500 09/25/13 2136 09/26/13 0606  BP: 118/73  127/76 112/70  Pulse: 89  91 84  Temp: 98.6 F (37 C)  98.3 F (36.8 C) 98.2 F (36.8 C)  TempSrc: Oral  Oral Oral  Resp: 17  18 18   Height:  6\' 1"  (1.854 m)    Weight:  91.4 kg (201 lb 8 oz)    SpO2: 96%  94% 97%  Intake/Output Summary (Last 24 hours) at 09/26/13 1131 Last data filed at 09/25/13 1812  Gross per 24 hour  Intake    423 ml  Output      0 ml  Net    423 ml   Filed Weights   09/23/13 2249  09/24/13 0533 09/25/13 1500  Weight: 90.22 kg (198 lb 14.4 oz) 90.22 kg (198 lb 14.4 oz) 91.4 kg (201 lb 8 oz)    Exam:   General:  A./O. x4, NAD,  Migraine has resolved  Cardiovascular: Regular rhythm and rate, negative murmurs rubs gallops  Respiratory: Clear to auscultation bilateral  Abdomen: Soft, nontender, nondistended, plus bowel sound  Musculoskeletal: Negative pedal edema   Data Reviewed: Basic Metabolic Panel:  Recent Labs Lab 09/23/13 1648 09/24/13 0406 09/25/13 1213  NA 139 139 140  K 4.5 4.7 5.1  CL 100 99 100  CO2 24 25 28   GLUCOSE 97 139* 106*  BUN 18 18 12   CREATININE 1.06 1.17 1.01  CALCIUM 9.8 9.1 9.6  MG  --   --  2.3   Liver Function Tests:  Recent Labs Lab 09/23/13 1648 09/24/13 0406 09/25/13 1213  AST 26 17 23   ALT 30 24 21   ALKPHOS 104 92 100  BILITOT 0.4 0.4 0.7  PROT 7.4 6.4 7.1  ALBUMIN 4.0 3.4* 3.7   No results found for this basename: LIPASE, AMYLASE,  in the last 168 hours No results found for this basename: AMMONIA,  in the last 168 hours CBC:  Recent Labs Lab 09/23/13 1648 09/24/13 0406 09/25/13 1213  WBC 9.9 14.4* 8.9  NEUTROABS  --  10.6* 5.0  HGB 17.1* 15.6 16.8  HCT 46.9 43.7 46.7  MCV 81.3 82.6 83.5  PLT 279 265 295   Cardiac Enzymes:  Recent Labs Lab 09/23/13 2130 09/24/13 0406 09/24/13 0900  TROPONINI <0.30 <0.30 <0.30   BNP (last 3 results) No results found for this basename: PROBNP,  in the last 8760 hours CBG: No results found for this basename: GLUCAP,  in the last 168 hours  No results found for this or any previous visit (from the past 240 hour(s)).   Studies: Nm Myocar Multi W/spect W/wall Motion / Ef  09/24/2013   CLINICAL DATA:  Chest pain  EXAM: Lexiscan Myovue  TECHNIQUE: The patient received IV Lexiscan .4mg  over 15 seconds. 33.0 mCi of Technetium 97m Sestamibi injected at 30 seconds. Quantitative SPECT images were obtained in the vertical, horizontal and short axis planes after a 45  minute delay. Rest images were obtained with similar planes and delay using 10.2 mCi of Technetium 66m Sestamibi.  FINDINGS: ECG:  SR, normal ECG, unchanged at stress  Symptoms:  SOB, abdominal cramping  RAW Data: There is significant subdiaphragmatic activity not affecting ability to read. LHR 0.35  Quantitiative Gated SPECT EF:  LVEF 65%, no wall motion abnormality.  Perfusion Images: There is small size mild severity reversible perfusion defect in the mid and apical anterior wall. (SDS 3).  IMPRESSION: Intermediate risk scan with small size mild reversible defect in the mid -distal LAD territory (SDS 3). Preserved LVEF and no wall motion abnormalities.  [name]   Electronically Signed   By: [name]   On: 09/24/2013 18:52    Scheduled Meds: . Melene Muller ON 09/27/2013] aspirin  81 mg Oral Pre-Cath  . aspirin EC  325 mg Oral Daily  . atorvastatin  40 mg Oral q1800  . [START ON 09/27/2013] diazepam  5 mg Oral On  Call  . enoxaparin (LOVENOX) injection  40 mg Subcutaneous Q24H  . lidocaine  1 patch Transdermal Q24H  . sodium chloride  3 mL Intravenous Q12H   Continuous Infusions:   Principal Problem:   Chest pain Active Problems:   Migraine   Shortness of breath   Atelectasis   Pre-syncope   HLD (hyperlipidemia)    Time spent: 35 minute   Ninfa Giannelli, J  [facility] Pager [phone]. If 7PM-7AM, please contact night-coverage at [email or web address], password [facility] 09/26/2013, 11:31 AM  LOS: 3 days

## 2013-09-26 NOTE — Progress Notes (Signed)
Consulting cardiologist:Ross, Gunnar Fusi MD Primary Cardiologist: New-Ross  Subjective:   Soreness in the left pectoral region. Reproducible on palpation. Has been up walking in the halls without chest pain, but mild shortness of breath.  Did not request NTG.   Objective:   Temp:  [98.1 F (36.7 C)-98.6 F (37 C)] 98.2 F (36.8 C) (03/15 0606) Pulse Rate:  [75-91] 84 (03/15 0606) Resp:  [16-18] 18 (03/15 0606) BP: (112-130)/(70-76) 112/70 mmHg (03/15 0606) SpO2:  [94 %-97 %] 97 % (03/15 0606) Weight:  [201 lb 8 oz (91.4 kg)] 201 lb 8 oz (91.4 kg) (03/14 1500) Last BM Date: 09/23/13  Filed Weights   09/23/13 2249 09/24/13 0533 09/25/13 1500  Weight: 198 lb 14.4 oz (90.22 kg) 198 lb 14.4 oz (90.22 kg) 201 lb 8 oz (91.4 kg)    Intake/Output Summary (Last 24 hours) at 09/26/13 0735 Last data filed at 09/25/13 1812  Gross per 24 hour  Intake    423 ml  Output      0 ml  Net    423 ml    Telemetry: NSR  Exam:  General: No acute distress.  HEENT: Conjunctiva and lids normal, oropharynx clear.  Lungs: Clear to auscultation, nonlabored.  Cardiac: No elevated JVP or bruits. RRR, no gallop or rub.   Abdomen: Normoactive bowel sounds, nontender, nondistended.  Extremities: No pitting edema, distal pulses full. Some soreness in the left upper chest, reproducible on palpation.  Neuropsychiatric: Alert and oriented x3, affect appropriate.   Lab Results:  Basic Metabolic Panel:  Recent Labs Lab 09/23/13 1648 09/24/13 0406 09/25/13 1213  NA 139 139 140  K 4.5 4.7 5.1  CL 100 99 100  CO2 24 25 28   GLUCOSE 97 139* 106*  BUN 18 18 12   CREATININE 1.06 1.17 1.01  CALCIUM 9.8 9.1 9.6  MG  --   --  2.3    Liver Function Tests:  Recent Labs Lab 09/23/13 1648 09/24/13 0406 09/25/13 1213  AST 26 17 23   ALT 30 24 21   ALKPHOS 104 92 100  BILITOT 0.4 0.4 0.7  PROT 7.4 6.4 7.1  ALBUMIN 4.0 3.4* 3.7    CBC:  Recent Labs Lab 09/23/13 1648 09/24/13 0406  09/25/13 1213  WBC 9.9 14.4* 8.9  HGB 17.1* 15.6 16.8  HCT 46.9 43.7 46.7  MCV 81.3 82.6 83.5  PLT 279 265 295    Cardiac Enzymes:  Recent Labs Lab 09/23/13 2130 09/24/13 0406 09/24/13 0900  TROPONINI <0.30 <0.30 <0.30      Coagulation:  Recent Labs Lab 09/24/13 0406 09/26/13 0533  INR 1.04 0.96    Radiology: Nm Myocar Multi W/spect W/wall Motion / Ef  09/24/2013   CLINICAL DATA:  Chest pain  EXAM: Lexiscan Myovue  TECHNIQUE: The patient received IV Lexiscan .4mg  over 15 seconds. 33.0 mCi of Technetium 30m Sestamibi injected at 30 seconds. Quantitative SPECT images were obtained in the vertical, horizontal and short axis planes after a 45 minute delay. Rest images were obtained with similar planes and delay using 10.2 mCi of Technetium 81m Sestamibi.  FINDINGS: ECG:  SR, normal ECG, unchanged at stress  Symptoms:  SOB, abdominal cramping  RAW Data: There is significant subdiaphragmatic activity not affecting ability to read. LHR 0.35  Quantitiative Gated SPECT EF:  LVEF 65%, no wall motion abnormality.  Perfusion Images: There is small size mild severity reversible perfusion defect in the mid and apical anterior wall. (SDS 3).  IMPRESSION: Intermediate risk scan with small  size mild reversible defect in the mid -distal LAD territory (SDS 3). Preserved LVEF and no wall motion abnormalities.  Tobias AlexanderKatarina Nelson   Electronically Signed   By: Tobias AlexanderKatarina  Nelson   On: 09/24/2013 18:52     Medications:   Scheduled Medications: . [START ON 09/27/2013] aspirin  81 mg Oral Pre-Cath  . aspirin EC  325 mg Oral Daily  . [START ON 09/27/2013] diazepam  5 mg Oral On Call  . enoxaparin (LOVENOX) injection  40 mg Subcutaneous Q24H  . lidocaine  1 patch Transdermal Q24H  . sodium chloride  3 mL Intravenous Q12H    PRN Medications: sodium chloride, gi cocktail, HYDROmorphone (DILAUDID) injection, nitroGLYCERIN, ondansetron (ZOFRAN) IV, sodium chloride   Assessment and Plan:   1.Chest Pain  with Abnormal NM stress test: Patient admitted with recurrent left-sided chest pain, dyspnea on exertion, weakness, and had a stress Cardiolite completed which was found to be abnormal in the LAD territory.Planned for cardiac cath in am. He is having reproducible to palpation left pectoral chest discomfort. I will provide pain control to assist. On cath board for am. Continue ASA. Awaiting EDG and colonoscopy report for documentation in the setting of GI bleed in 2013, from [facility].   2. Hypercholesterolemia: TC 219, TG 181, HDL 41, LDL 142. Begin atorvastatin 40 mg daily.       Bettey MareKathryn M. Lyman BishopLawrence NP [facility] 09/26/2013, 7:35 AM  Agree with assessment and plan as noted above.  The patient is not having any chest discomfort this morning but is still sore in the left parasternal region on firm palpation.  He has a mildly abnormal Myoview stress test.  He will undergo cardiac catheterization tomorrow. Physical examination unremarkable except for tenderness in left chest to palpation. Telemetry stable normal sinus rhythm.  Troponins have been negative.

## 2013-09-27 ENCOUNTER — Encounter (HOSPITAL_COMMUNITY)
Admission: EM | Disposition: A | Discharge status: Home / Self Care | Payer: Self-pay | Source: Home / Self Care | Attending: Emergency Medicine

## 2013-09-27 DIAGNOSIS — R9430 Abnormal result of cardiovascular function study, unspecified: Secondary | ICD-10-CM

## 2013-09-27 DIAGNOSIS — R0602 Shortness of breath: Secondary | ICD-10-CM | POA: Diagnosis present

## 2013-09-27 HISTORY — PX: LEFT HEART CATHETERIZATION WITH CORONARY ANGIOGRAM: SHX5451

## 2013-09-27 SURGERY — LEFT HEART CATHETERIZATION WITH CORONARY ANGIOGRAM
Anesthesia: LOCAL

## 2013-09-27 MED ORDER — SODIUM CHLORIDE 0.9 % IV SOLN
INTRAVENOUS | Status: DC
  Administered 2013-09-27: 09:00:00 via INTRAVENOUS

## 2013-09-27 MED ORDER — ATORVASTATIN CALCIUM 40 MG PO TABS: 40.0000 mg | ORAL_TABLET | Freq: Every day | ORAL | Status: DC

## 2013-09-27 MED ORDER — LIDOCAINE HCL (PF) 1 % IJ SOLN
INTRAMUSCULAR | Status: AC
  Filled 2013-09-27: qty 30

## 2013-09-27 MED ORDER — HEPARIN SODIUM (PORCINE) 1000 UNIT/ML IJ SOLN
INTRAMUSCULAR | Status: AC
  Filled 2013-09-27: qty 1

## 2013-09-27 MED ORDER — HEPARIN (PORCINE) IN NACL 2-0.9 UNIT/ML-% IJ SOLN
INTRAMUSCULAR | Status: AC
  Filled 2013-09-27: qty 1000

## 2013-09-27 MED ORDER — MIDAZOLAM HCL 2 MG/2ML IJ SOLN
INTRAMUSCULAR | Status: AC
  Filled 2013-09-27: qty 2

## 2013-09-27 MED ORDER — NITROGLYCERIN 0.2 MG/ML ON CALL CATH LAB
INTRAVENOUS | Status: AC
Start: 2013-09-27 — End: 2013-09-27
  Filled 2013-09-27: qty 1

## 2013-09-27 MED ORDER — SODIUM CHLORIDE 0.9 % IV SOLN: 1.0000 mL/kg/h | INTRAVENOUS | Status: AC

## 2013-09-27 MED ORDER — VERAPAMIL HCL 2.5 MG/ML IV SOLN
INTRAVENOUS | Status: AC
  Filled 2013-09-27: qty 2

## 2013-09-27 MED ORDER — FENTANYL CITRATE 0.05 MG/ML IJ SOLN
INTRAMUSCULAR | Status: AC
  Filled 2013-09-27: qty 2

## 2013-09-27 NOTE — Care Management Note (Unsigned)
    Page 1 of 1   09/27/2013     10:36:35 AM   CARE MANAGEMENT NOTE 09/27/2013  Patient:  Frank Soto,Frank W   Account Number:  [id number]  Date Initiated:  09/27/2013  Documentation initiated by:  GRAVES-BIGELOW,Christ Fullenwider  Subjective/Objective Assessment:   Pt admitted for chest pain- had abnormal myoview. Plan for cardiac cath 09-27-13.     Action/Plan:   CM will continue to monitor for disposition needs.   Anticipated DC Date:  09/28/2013   Anticipated DC Plan:  HOME/SELF CARE      DC Planning Services  CM consult  Medication Assistance      Choice offered to / List presented to:             Status of service:  In process, will continue to follow Medicare Important Message given?   (If response is "NO", the following Medicare IM given date fields will be blank) Date Medicare IM given:   Date Additional Medicare IM given:    Discharge Disposition:    Per UR Regulation:  Reviewed for med. necessity/level of care/duration of stay  If discussed at Long Length of Stay Meetings, dates discussed:    Comments:

## 2013-09-27 NOTE — Interval H&P Note (Signed)
History and Physical Interval Note:  09/27/2013 10:40 AM  Frank Soto  has presented today for surgery, with the diagnosis of cp  The various methods of treatment have been discussed with the patient and family. After consideration of risks, benefits and other options for treatment, the patient has consented to  Procedure(s): LEFT HEART CATHETERIZATION WITH CORONARY ANGIOGRAM (N/A) as a surgical intervention .  The patient's history has been reviewed, patient examined, no change in status, stable for surgery.  I have reviewed the patient's chart and labs.  Questions were answered to the patient's satisfaction.   Cath Lab Visit (complete for each Cath Lab visit)  Clinical Evaluation Leading to the Procedure:   ACS: yes  Non-ACS:    Anginal Classification: CCS III  Anti-ischemic medical therapy: No Therapy  Non-Invasive Test Results: Intermediate-risk stress test findings: cardiac mortality 1-3%/year  Prior CABG: No previous CABG        Frank Kunert [place] MD,FACC 09/27/2013 10:41 AM

## 2013-09-27 NOTE — Discharge Summary (Signed)
Physician Discharge Summary  Frank Soto ZOX:[id number] DOB: 1970/11/03 DOA: 09/23/2013  PCP: No primary provider on file.  Admit date: 09/23/2013 Discharge date: 09/27/2013  Time spent:29minutes  Recommendations for Outpatient Follow-up:  Chest pain  - Troponin x3 negative  -Echocardiogram unrevealing for patient's continued chest pain  -3/13 EKG showed sinus arrhythmia with LAD  -Spoke with Dr. Elease Hashimoto (cardiologist) arranged Nuclear medicine stress test showed reversible defect in LAD; see results below  -Dr. Dietrich Pates (cardiology) saw patient on 3/14 and schedule patient for 3/16; CP noncardiac in nature see results below   Migraine headache  -Secondary to nitroglycerin. DC nitroglycerin  -Resolved    HLD  -Start Lipitor 40 mg daily  -Goal LDL100mg /dL  SOB -Discharge patient with instructions to followup at [facility] pulmonology for spirometry pre-/post bronchodilator, DLCO     Discharge Diagnoses:  Principal Problem:   Chest pain Active Problems:   Migraine   Shortness of breath   Atelectasis   Pre-syncope   HLD (hyperlipidemia)   SOB (shortness of breath)   Discharge Condition: Stable  Diet recommendation: Heart healthy  Filed Weights   09/23/13 2249 09/24/13 0533 09/25/13 1500  Weight: 90.22 kg (198 lb 14.4 oz) 90.22 kg (198 lb 14.4 oz) 91.4 kg (201 lb 8 oz)    History of present illness:    Hospital Course:  Frank Soto is a 43 y.o WM PMHx migraine. HA, Hx exertional chest pain x 2 years has not sought treatment until this admission  Patient states he went to work today picked up a piece of wood weighing approximately 30 pounds began to have substernal CP, positive DOE, positive nausea. States but down wood however chest pain did not resolve. Throat himself to [facility] cone at which time had been having approximately 20 minutes of continuous substernal CP.  State was walking across the parking lot today at which time CP increased in intensity,  positive radiation across whole chest. Described as stabbing and worsening with breathing associated with dry cough. After that he continues to have shortness of breath on exertion and some mild chest pain. At that time he also had an episode of nearly passing out dizziness without any vertigo. He denies any significant family history of coronary artery disease, recent travel, immobilization, orthopnea, PND, fever, chills.  He denies any alcohol abuse, drug abuse. 3/13 Currently complaining of migraine headache secondary to nitro paste. Also states having CP (patient just returned from echocardiogram). Positive mild SOB, radiation across chest. Rated 7/10. 3/14 patient states still having some residual chest pain rated at 2/10, described as dull, negative radiation, negative SOB, negative N./V., negative diaphoresis. 3/15 states had one small episode of mild CP overnight., Still has sternal CP to palpation but improved. States migraine headache has almost fully resolved. 3/16 patient feeling greatly improved since admission. Still concerned that the CP/SOB unexplained by cardiac catheterization. Patient and wife understand workup will continue as outpatient.  Consultants:  Dr. Dietrich Pates (cardiology)  Dr. Peter Swaziland (cardiologist)   Procedures:  Left Heart Cath, Selective Coronary Angiography, LV angiography 09/27/2013 Final Conclusions:  1. Normal coronary anatomy  2. Normal LV function.  Nuclear meds stress test 09/24/2013  Intermediate risk scan with small size mild reversible defect in the  mid -distal LAD territory (SDS 3). Preserved LVEF and no wall motion  abnormalities.   Echocardiogram 09/24/2013  - Left ventricle: The cavity size was normal. Wall thickness was normal. T  -LVEF= 65%. Wall motion was normal; there were no  regional wall motion abnormalities. - Right ventricle: The cavity size was normal. Systolic function was normal.  CT angiogram chest PE protocol 09/23/2013  1. No  evidence of a pulmonary embolus.  2. No acute findings. Mild dependent and lower lung zone  subsegmental atelectasis.  CXR 09/23/2013  No acute abnormality noted.     Antibiotics:    Discharge Exam: Filed Vitals:   09/27/13 1145 09/27/13 1200 09/27/13 1230 09/27/13 1300  BP: 118/79 118/75 120/68 127/72  Pulse: 73 80 94 78  Temp:      TempSrc:      Resp:      Height:      Weight:      SpO2: 96% 98% 97% 98%   General: A./O. x4, NAD, Migraine has resolved  Cardiovascular: Regular rhythm and rate, negative murmurs rubs gallops  Respiratory: Clear to auscultation bilateral  Abdomen: Soft, nontender, nondistended, plus bowel sound  Musculoskeletal: Negative pedal edema, , right forearm appropriately tender where her cardiac catheterization was inserted    Discharge Instructions     Medication List         atorvastatin 40 MG tablet  Commonly known as:  LIPITOR  Take 1 tablet (40 mg total) by mouth daily at 6 PM.     ibuprofen 200 MG tablet  Commonly known as:  ADVIL,MOTRIN  Take 800 mg by mouth every 6 (six) hours as needed for headache or mild pain.     promethazine 25 MG tablet  Commonly known as:  PHENERGAN  Take 25 mg by mouth every 6 (six) hours as needed for nausea or vomiting.     SUMAtriptan 100 MG tablet  Commonly known as:  IMITREX  Take 100 mg by mouth every 2 (two) hours as needed for migraine or headache. May repeat in 2 hours if headache persists or recurs.       No Known Allergies Follow-up Information   Follow up with [facility] In 1 week. (Patient with previous history of smoking/drugs abuse having increasing episodes of DOE/SOB/CP; cardiac cath negative. Spirometry pre-/post bronchodilator with DLCO)    Specialty:  Pulmonology   Contact information:   [address] [place] [address] [phone]       The results of significant diagnostics from this hospitalization (including imaging, microbiology, ancillary and  laboratory) are listed below for reference.    Significant Diagnostic Studies: Dg Chest 2 View  09/23/2013   CLINICAL DATA:  Chest pain  EXAM: CHEST  2 VIEW  COMPARISON:  04/09/2009  FINDINGS: The heart size and mediastinal contours are within normal limits. Both lungs are clear. The visualized skeletal structures are unremarkable.  IMPRESSION: No acute abnormality noted.   Electronically Signed   By: Alcide Clever M.D.   On: 09/23/2013 17:56   Ct Angio Chest Pe W/cm &/or Wo Cm  09/23/2013   CLINICAL DATA:  Short of breath.  Chest pain.  EXAM: CT ANGIOGRAPHY CHEST WITH CONTRAST  TECHNIQUE: Multidetector CT imaging of the chest was performed using the standard protocol during bolus administration of intravenous contrast. Multiplanar CT image reconstructions and MIPs were obtained to evaluate the vascular anatomy.  CONTRAST:  OMNIPAQUE IOHEXOL 350 MG/ML SOLN  COMPARISON:  Current chest radiograph.  FINDINGS: No evidence of a pulmonary embolus.  Heart is normal in size and configuration. Great vessels are normal in caliber. No aortic dissection there is a borderline enlarged left para carinal lymph node, most likely reactive. No other prominent or enlarged lymph nodes.  No mediastinal or hilar masses. The  Mild lower lung zone and dependent subsegmental atelectasis. Lungs are otherwise clear. No pleural effusion. No pneumothorax.  There is mild deformity of the sternum consistent with an old, healed fracture. No other bony abnormality.  Review of the MIP images confirms the above findings.  IMPRESSION: 1. No evidence of a pulmonary embolus. 2. No acute findings. Mild dependent and lower lung zone subsegmental atelectasis.   Electronically Signed   By: Amie Portlandavid  Ormond M.D.   On: 09/23/2013 20:29   Nm Myocar Multi W/spect W/wall Motion / Ef  09/24/2013   CLINICAL DATA:  Chest pain  EXAM: Lexiscan Myovue  TECHNIQUE: The patient received IV Lexiscan .4mg  over 15 seconds. 33.0 mCi of Technetium 5253m Sestamibi  injected at 30 seconds. Quantitative SPECT images were obtained in the vertical, horizontal and short axis planes after a 45 minute delay. Rest images were obtained with similar planes and delay using 10.2 mCi of Technetium 8553m Sestamibi.  FINDINGS: ECG:  SR, normal ECG, unchanged at stress  Symptoms:  SOB, abdominal cramping  RAW Data: There is significant subdiaphragmatic activity not affecting ability to read. LHR 0.35  Quantitiative Gated SPECT EF:  LVEF 65%, no wall motion abnormality.  Perfusion Images: There is small size mild severity reversible perfusion defect in the mid and apical anterior wall. (SDS 3).  IMPRESSION: Intermediate risk scan with small size mild reversible defect in the mid -distal LAD territory (SDS 3). Preserved LVEF and no wall motion abnormalities.  Tobias AlexanderKatarina Nelson   Electronically Signed   By: Tobias AlexanderKatarina  Nelson   On: 09/24/2013 18:52    Microbiology: No results found for this or any previous visit (from the past 240 hour(s)).   Labs: Basic Metabolic Panel:  Recent Labs Lab 09/23/13 1648 09/24/13 0406 09/25/13 1213  NA 139 139 140  K 4.5 4.7 5.1  CL 100 99 100  CO2 24 25 28   GLUCOSE 97 139* 106*  BUN 18 18 12   CREATININE 1.06 1.17 1.01  CALCIUM 9.8 9.1 9.6  MG  --   --  2.3   Liver Function Tests:  Recent Labs Lab 09/23/13 1648 09/24/13 0406 09/25/13 1213  AST 26 17 23   ALT 30 24 21   ALKPHOS 104 92 100  BILITOT 0.4 0.4 0.7  PROT 7.4 6.4 7.1  ALBUMIN 4.0 3.4* 3.7   No results found for this basename: LIPASE, AMYLASE,  in the last 168 hours No results found for this basename: AMMONIA,  in the last 168 hours CBC:  Recent Labs Lab 09/23/13 1648 09/24/13 0406 09/25/13 1213  WBC 9.9 14.4* 8.9  NEUTROABS  --  10.6* 5.0  HGB 17.1* 15.6 16.8  HCT 46.9 43.7 46.7  MCV 81.3 82.6 83.5  PLT 279 265 295   Cardiac Enzymes:  Recent Labs Lab 09/23/13 2130 09/24/13 0406 09/24/13 0900  TROPONINI <0.30 <0.30 <0.30   BNP: BNP (last 3 results) No  results found for this basename: PROBNP,  in the last 8760 hours CBG: No results found for this basename: GLUCAP,  in the last 168 hours     Signed:  Carolyne Littlesurtis Caliann Leckrone, MD [facility] [phone] pager

## 2013-09-27 NOTE — Progress Notes (Signed)
Reviewed discharge instructions with patient and wife and they stated their understanding.  Spoke with Care Manager whom [facility] at 2:45 and patient knows to call pulmonary for new referal appointment.  Per instruction from Dr. Joseph ArtWoods, patient may return to work on Wednesday with the restrictions of no lifting over 5 lbs with right arm/wrist; if job not able to comply with restrictions, may return to work in 5 days; patient updated.  Discharged home with wife.  Colman Caterarpley, Elizabet Schweppe Danielle

## 2013-09-27 NOTE — H&P (View-Only) (Signed)
Patient Name: Frank Soto Date of Encounter: 09/27/2013     Principal Problem:   Chest pain Active Problems:   Migraine   Shortness of breath   Atelectasis   Pre-syncope   HLD (hyperlipidemia)    SUBJECTIVE  The patient has had a good night.  No further chest discomfort.  Still slightly tender on palpation of left parasternal area.  He is awaiting cardiac catheterization today because of his abnormal Myoview.  CURRENT MEDS . aspirin EC  325 mg Oral Daily  . atorvastatin  40 mg Oral q1800  . diazepam  5 mg Oral On Call  . enoxaparin (LOVENOX) injection  40 mg Subcutaneous Q24H  . lidocaine  1 patch Transdermal Q24H  . sodium chloride  3 mL Intravenous Q12H    OBJECTIVE  Filed Vitals:   09/26/13 0606 09/26/13 1421 09/26/13 2100 09/27/13 0531  BP: 112/70 132/88 125/90 104/68  Pulse: 84 68 75 71  Temp: 98.2 F (36.8 C) 98.2 F (36.8 C) 97.8 F (36.6 C) 97.7 F (36.5 C)  TempSrc: Oral Oral Oral Oral  Resp: 18 17 16 16   Height:      Weight:      SpO2: 97% 98% 99% 97%    Intake/Output Summary (Last 24 hours) at 09/27/13 0916 Last data filed at 09/26/13 1237  Gross per 24 hour  Intake    240 ml  Output      0 ml  Net    240 ml   Filed Weights   09/23/13 2249 09/24/13 0533 09/25/13 1500  Weight: 198 lb 14.4 oz (90.22 kg) 198 lb 14.4 oz (90.22 kg) 201 lb 8 oz (91.4 kg)    PHYSICAL EXAM  General: Pleasant, NAD. Neuro: Alert and oriented X 3. Moves all extremities spontaneously. Psych: Normal affect. HEENT:  Normal  Neck: Supple without bruits or JVD. Lungs:  Resp regular and unlabored, CTA.  Mild tenderness along left parasternal area. Heart: RRR no s3, s4, or murmurs. Abdomen: Soft, non-tender, non-distended, BS + x 4.  Extremities: No clubbing, cyanosis or edema. DP/PT/Radials 2+ and equal bilaterally.  Accessory Clinical Findings  CBC  Recent Labs  09/25/13 1213  WBC 8.9  NEUTROABS 5.0  HGB 16.8  HCT 46.7  MCV 83.5  PLT 295   Basic  Metabolic Panel  Recent Labs  09/25/13 1213  NA 140  K 5.1  CL 100  CO2 28  GLUCOSE 106*  BUN 12  CREATININE 1.01  CALCIUM 9.6  MG 2.3   Liver Function Tests  Recent Labs  09/25/13 1213  AST 23  ALT 21  ALKPHOS 100  BILITOT 0.7  PROT 7.1  ALBUMIN 3.7   No results found for this basename: LIPASE, AMYLASE,  in the last 72 hours Cardiac Enzymes No results found for this basename: CKTOTAL, CKMB, CKMBINDEX, TROPONINI,  in the last 72 hours BNP No components found with this basename: POCBNP,  D-Dimer No results found for this basename: DDIMER,  in the last 72 hours Hemoglobin A1C No results found for this basename: HGBA1C,  in the last 72 hours Fasting Lipid Panel  Recent Labs  09/26/13 0533  CHOL 219*  HDL 41  LDLCALC 142*  TRIG 181*  CHOLHDL 5.3   Thyroid Function Tests No results found for this basename: TSH, T4TOTAL, FREET3, T3FREE, THYROIDAB,  in the last 72 hours  TELE  Telemetry shows normal sinus rhythm.  ECG  EKG on 09/24/13 shows no acute change  Radiology/Studies  Dg Chest  2 View  09/23/2013   CLINICAL DATA:  Chest pain  EXAM: CHEST  2 VIEW  COMPARISON:  04/09/2009  FINDINGS: The heart size and mediastinal contours are within normal limits. Both lungs are clear. The visualized skeletal structures are unremarkable.  IMPRESSION: No acute abnormality noted.   Electronically Signed   By: Alcide Clever M.D.   On: 09/23/2013 17:56   Ct Angio Chest Pe W/cm &/or Wo Cm  09/23/2013   CLINICAL DATA:  Short of breath.  Chest pain.  EXAM: CT ANGIOGRAPHY CHEST WITH CONTRAST  TECHNIQUE: Multidetector CT imaging of the chest was performed using the standard protocol during bolus administration of intravenous contrast. Multiplanar CT image reconstructions and MIPs were obtained to evaluate the vascular anatomy.  CONTRAST:  OMNIPAQUE IOHEXOL 350 MG/ML SOLN  COMPARISON:  Current chest radiograph.  FINDINGS: No evidence of a pulmonary embolus.  Heart is normal in  size and configuration. Great vessels are normal in caliber. No aortic dissection there is a borderline enlarged left para carinal lymph node, most likely reactive. No other prominent or enlarged lymph nodes. No mediastinal or hilar masses. The  Mild lower lung zone and dependent subsegmental atelectasis. Lungs are otherwise clear. No pleural effusion. No pneumothorax.  There is mild deformity of the sternum consistent with an old, healed fracture. No other bony abnormality.  Review of the MIP images confirms the above findings.  IMPRESSION: 1. No evidence of a pulmonary embolus. 2. No acute findings. Mild dependent and lower lung zone subsegmental atelectasis.   Electronically Signed   By: Amie Portland M.D.   On: 09/23/2013 20:29   Nm Myocar Multi W/spect W/wall Motion / Ef  09/24/2013   CLINICAL DATA:  Chest pain  EXAM: Lexiscan Myovue  TECHNIQUE: The patient received IV Lexiscan .4mg  over 15 seconds. 33.0 mCi of Technetium 91m Sestamibi injected at 30 seconds. Quantitative SPECT images were obtained in the vertical, horizontal and short axis planes after a 45 minute delay. Rest images were obtained with similar planes and delay using 10.2 mCi of Technetium 23m Sestamibi.  FINDINGS: ECG:  SR, normal ECG, unchanged at stress  Symptoms:  SOB, abdominal cramping  RAW Data: There is significant subdiaphragmatic activity not affecting ability to read. LHR 0.35  Quantitiative Gated SPECT EF:  LVEF 65%, no wall motion abnormality.  Perfusion Images: There is small size mild severity reversible perfusion defect in the mid and apical anterior wall. (SDS 3).  IMPRESSION: Intermediate risk scan with small size mild reversible defect in the mid -distal LAD territory (SDS 3). Preserved LVEF and no wall motion abnormalities.  Frank Soto   Electronically Signed   By: Frank Soto   On: 09/24/2013 18:52    ASSESSMENT AND PLAN 1.Chest Pain with Abnormal NM stress test: Patient admitted with recurrent left-sided  chest pain, dyspnea on exertion, weakness, and had a stress Cardiolite completed which was found to be abnormal in the LAD territory.Planned for cardiac cath today.  Continue ASA. Awaiting EDG and colonoscopy report for documentation in the setting of GI bleed in 2013, from [facility].  2. Hypercholesterolemia: TC 219, TG 181, HDL 41, LDL 142.  On atorvastatin 40 mg daily which is a new medication for him.   Plan: Cardiac catheterization today  Signed, Cassell Clement MD

## 2013-09-27 NOTE — Progress Notes (Signed)
Patient Name: Frank Soto Date of Encounter: 09/27/2013     Principal Problem:   Chest pain Active Problems:   Migraine   Shortness of breath   Atelectasis   Pre-syncope   HLD (hyperlipidemia)    SUBJECTIVE  The patient has had a good night.  No further chest discomfort.  Still slightly tender on palpation of left parasternal area.  He is awaiting cardiac catheterization today because of his abnormal Myoview.  CURRENT MEDS . aspirin EC  325 mg Oral Daily  . atorvastatin  40 mg Oral q1800  . diazepam  5 mg Oral On Call  . enoxaparin (LOVENOX) injection  40 mg Subcutaneous Q24H  . lidocaine  1 patch Transdermal Q24H  . sodium chloride  3 mL Intravenous Q12H    OBJECTIVE  Filed Vitals:   09/26/13 0606 09/26/13 1421 09/26/13 2100 09/27/13 0531  BP: 112/70 132/88 125/90 104/68  Pulse: 84 68 75 71  Temp: 98.2 F (36.8 C) 98.2 F (36.8 C) 97.8 F (36.6 C) 97.7 F (36.5 C)  TempSrc: Oral Oral Oral Oral  Resp: 18 17 16 16   Height:      Weight:      SpO2: 97% 98% 99% 97%    Intake/Output Summary (Last 24 hours) at 09/27/13 0916 Last data filed at 09/26/13 1237  Gross per 24 hour  Intake    240 ml  Output      0 ml  Net    240 ml   Filed Weights   09/23/13 2249 09/24/13 0533 09/25/13 1500  Weight: 198 lb 14.4 oz (90.22 kg) 198 lb 14.4 oz (90.22 kg) 201 lb 8 oz (91.4 kg)    PHYSICAL EXAM  General: Pleasant, NAD. Neuro: Alert and oriented X 3. Moves all extremities spontaneously. Psych: Normal affect. HEENT:  Normal  Neck: Supple without bruits or JVD. Lungs:  Resp regular and unlabored, CTA.  Mild tenderness along left parasternal area. Heart: RRR no s3, s4, or murmurs. Abdomen: Soft, non-tender, non-distended, BS + x 4.  Extremities: No clubbing, cyanosis or edema. DP/PT/Radials 2+ and equal bilaterally.  Accessory Clinical Findings  CBC  Recent Labs  09/25/13 1213  WBC 8.9  NEUTROABS 5.0  HGB 16.8  HCT 46.7  MCV 83.5  PLT 295   Basic  Metabolic Panel  Recent Labs  09/25/13 1213  NA 140  K 5.1  CL 100  CO2 28  GLUCOSE 106*  BUN 12  CREATININE 1.01  CALCIUM 9.6  MG 2.3   Liver Function Tests  Recent Labs  09/25/13 1213  AST 23  ALT 21  ALKPHOS 100  BILITOT 0.7  PROT 7.1  ALBUMIN 3.7   No results found for this basename: LIPASE, AMYLASE,  in the last 72 hours Cardiac Enzymes No results found for this basename: CKTOTAL, CKMB, CKMBINDEX, TROPONINI,  in the last 72 hours BNP No components found with this basename: POCBNP,  D-Dimer No results found for this basename: DDIMER,  in the last 72 hours Hemoglobin A1C No results found for this basename: HGBA1C,  in the last 72 hours Fasting Lipid Panel  Recent Labs  09/26/13 0533  CHOL 219*  HDL 41  LDLCALC 142*  TRIG 181*  CHOLHDL 5.3   Thyroid Function Tests No results found for this basename: TSH, T4TOTAL, FREET3, T3FREE, THYROIDAB,  in the last 72 hours  TELE  Telemetry shows normal sinus rhythm.  ECG  EKG on 09/24/13 shows no acute change  Radiology/Studies  Dg Chest  2 View  09/23/2013   CLINICAL DATA:  Chest pain  EXAM: CHEST  2 VIEW  COMPARISON:  04/09/2009  FINDINGS: The heart size and mediastinal contours are within normal limits. Both lungs are clear. The visualized skeletal structures are unremarkable.  IMPRESSION: No acute abnormality noted.   Electronically Signed   By: Frank Clever M.D.   On: 09/23/2013 17:56   Ct Angio Chest Pe W/cm &/or Wo Cm  09/23/2013   CLINICAL DATA:  Short of breath.  Chest pain.  EXAM: CT ANGIOGRAPHY CHEST WITH CONTRAST  TECHNIQUE: Multidetector CT imaging of the chest was performed using the standard protocol during bolus administration of intravenous contrast. Multiplanar CT image reconstructions and MIPs were obtained to evaluate the vascular anatomy.  CONTRAST:  OMNIPAQUE IOHEXOL 350 MG/ML SOLN  COMPARISON:  Current chest radiograph.  FINDINGS: No evidence of a pulmonary embolus.  Heart is normal in  size and configuration. Great vessels are normal in caliber. No aortic dissection there is a borderline enlarged left para carinal lymph node, most likely reactive. No other prominent or enlarged lymph nodes. No mediastinal or hilar masses. The  Mild lower lung zone and dependent subsegmental atelectasis. Lungs are otherwise clear. No pleural effusion. No pneumothorax.  There is mild deformity of the sternum consistent with an old, healed fracture. No other bony abnormality.  Review of the MIP images confirms the above findings.  IMPRESSION: 1. No evidence of a pulmonary embolus. 2. No acute findings. Mild dependent and lower lung zone subsegmental atelectasis.   Electronically Signed   By: Frank Portland M.D.   On: 09/23/2013 20:29   Nm Myocar Multi W/spect W/wall Motion / Ef  09/24/2013   CLINICAL DATA:  Chest pain  EXAM: Lexiscan Myovue  TECHNIQUE: The patient received IV Lexiscan .4mg  over 15 seconds. 33.0 mCi of Technetium 91m Sestamibi injected at 30 seconds. Quantitative SPECT images were obtained in the vertical, horizontal and short axis planes after a 45 minute delay. Rest images were obtained with similar planes and delay using 10.2 mCi of Technetium 23m Sestamibi.  FINDINGS: ECG:  SR, normal ECG, unchanged at stress  Symptoms:  SOB, abdominal cramping  RAW Data: There is significant subdiaphragmatic activity not affecting ability to read. LHR 0.35  Quantitiative Gated SPECT EF:  LVEF 65%, no wall motion abnormality.  Perfusion Images: There is small size mild severity reversible perfusion defect in the mid and apical anterior wall. (SDS 3).  IMPRESSION: Intermediate risk scan with small size mild reversible defect in the mid -distal LAD territory (SDS 3). Preserved LVEF and no wall motion abnormalities.  Frank Soto   Electronically Signed   By: Frank Soto   On: 09/24/2013 18:52    ASSESSMENT AND PLAN 1.Chest Pain with Abnormal NM stress test: Patient admitted with recurrent left-sided  chest pain, dyspnea on exertion, weakness, and had a stress Cardiolite completed which was found to be abnormal in the LAD territory.Planned for cardiac cath today.  Continue ASA. Awaiting EDG and colonoscopy report for documentation in the setting of GI bleed in 2013, from [facility].  2. Hypercholesterolemia: TC 219, TG 181, HDL 41, LDL 142.  On atorvastatin 40 mg daily which is a new medication for him.   Plan: Cardiac catheterization today  Signed, Cassell Clement MD

## 2013-09-27 NOTE — Discharge Instructions (Signed)
Chest Pain (Nonspecific) °Chest pain has many causes. Your pain could be caused by something serious, such as a heart attack or a blood clot in the lungs. It could also be caused by something less serious, such as a chest bruise or a virus. Follow up with your doctor. More lab tests or other studies may be needed to find the cause of your pain. Most of the time, nonspecific chest pain will improve within 2 to 3 days of rest and mild pain medicine. °HOME CARE °· For chest bruises, you may put ice on the sore area for 15-20 minutes, 03-04 times a day. Do this only if it makes you feel better. °· Put ice in a plastic bag. °· Place a towel between the skin and the bag. °· Rest for the next 2 to 3 days. °· Go back to work if the pain improves. °· See your doctor if the pain lasts longer than 1 to 2 weeks. °· Only take medicine as told by your doctor. °· Quit smoking if you smoke. °GET HELP RIGHT AWAY IF:  °· There is more pain or pain that spreads to the arm, neck, jaw, back, or belly (abdomen). °· You have shortness of breath. °· You cough more than usual or cough up blood. °· You have very bad back or belly pain, feel sick to your stomach (nauseous), or throw up (vomit). °· You have very bad weakness. °· You pass out (faint). °· You have a fever. °Any of these problems may be serious and may be an emergency. Do not wait to see if the problems will go away. Get medical help right away. Call your local emergency services 911 in [place] Do not drive yourself to the hospital. °MAKE SURE YOU:  °· Understand these instructions. °· Will watch this condition. °· Will get help right away if you or your child is not doing well or gets worse. °Document Released: 12/18/2007 Document Revised: 09/23/2011 Document Reviewed: 12/18/2007 °ExitCare® Patient Information ©2014 [employer]. ° °

## 2013-09-27 NOTE — Progress Notes (Signed)
Right radial TR band removed without complications, 2x2 and tegaderm applied.  Patient given radial care instructions. Right radial level 0.  Colman Caterarpley, Sonnia Strong Danielle

## 2013-09-27 NOTE — CV Procedure (Signed)
    Cardiac Catheterization Procedure Note  Name: Frank Soto MRN: [id number] DOB: 08/03/1970  Procedure: Left Heart Cath, Selective Coronary Angiography, LV angiography  Indication: 43 yo WM with history of chest pain. Stress myoview was abnormal with mild apical defect.    Procedural Details: The right wrist was prepped, draped, and anesthetized with 1% lidocaine. Using the modified Seldinger technique, a 5 French sheath was introduced into the right radial artery. 3 mg of verapamil was administered through the sheath, weight-based unfractionated heparin was administered intravenously. Standard Judkins catheters were used for selective coronary angiography and left ventriculography. Catheter exchanges were performed over an exchange length guidewire. There were no immediate procedural complications. A TR band was used for radial hemostasis at the completion of the procedure.  The patient was transferred to the post catheterization recovery area for further monitoring.  Procedural Findings: Hemodynamics: AO 107/79 mean 93 mm Hg LV 107/13 mm hg  Coronary angiography: Coronary dominance: right  Left mainstem: Normal.   Left anterior descending (LAD): The LAD gives rise to a large first diagonal. The mid LAD tapers quickly and the distal vessel is diminutive. No disease noted. The apex is supplied by then large OM and PDA vessels.  Left circumflex (LCx): The LCx gives rise to a very large bifurcating OM then terminates in a smaller OM branch. It is normal.  Right coronary artery (RCA): Normal.  Left ventriculography: Left ventricular systolic function is normal, LVEF is estimated at 55-65%, there is no significant mitral regurgitation   Final Conclusions:   1. Normal coronary anatomy 2. Normal LV function.  Recommendations: lipid lowering therapy. Stable for DC today from a cardiac standpoint.   Peter Phys[facility] Choice Surgicenter IncJordanMD,FACC 09/27/2013, 11:09 AM

## 2013-09-28 NOTE — ED Provider Notes (Signed)
Medical screening examination/treatment/procedure(s) were performed by non-physician practitioner and as supervising physician I was immediately available for consultation/collaboration.   EKG Interpretation   Date/Time:  Thursday September 23 2013 16:42:49 EDT Ventricular Rate:  84 PR Interval:  140 QRS Duration: 94 QT Interval:  352 QTC Calculation: 415 R Axis:   -31 Text Interpretation:  Normal sinus rhythm with sinus arrhythmia Left axis  deviation Abnormal ECG ED PHYSICIAN INTERPRETATION AVAILABLE IN [facility] Confirmed by TEST, Record ([id number]) on 09/25/2013 9:15:05 AM        Shelda JakesScott W. Ting Cage, MD 09/28/13 1408

## 2013-09-29 ENCOUNTER — Ambulatory Visit: Payer: Self-pay | Attending: Cardiology | Admitting: Cardiology

## 2013-09-29 ENCOUNTER — Encounter: Payer: Self-pay | Admitting: Cardiology

## 2013-09-29 VITALS — BP 133/88 | HR 71 | Temp 98.4°F | Resp 16 | Ht 73.0 in | Wt 204.0 lb

## 2013-09-29 DIAGNOSIS — R079 Chest pain, unspecified: Secondary | ICD-10-CM | POA: Insufficient documentation

## 2013-09-29 DIAGNOSIS — Z87442 Personal history of urinary calculi: Secondary | ICD-10-CM | POA: Insufficient documentation

## 2013-09-29 DIAGNOSIS — J9819 Other pulmonary collapse: Secondary | ICD-10-CM

## 2013-09-29 DIAGNOSIS — Z87891 Personal history of nicotine dependence: Secondary | ICD-10-CM | POA: Insufficient documentation

## 2013-09-29 DIAGNOSIS — J9811 Atelectasis: Secondary | ICD-10-CM

## 2013-09-29 DIAGNOSIS — G43909 Migraine, unspecified, not intractable, without status migrainosus: Secondary | ICD-10-CM | POA: Insufficient documentation

## 2013-09-29 DIAGNOSIS — Z79899 Other long term (current) drug therapy: Secondary | ICD-10-CM | POA: Insufficient documentation

## 2013-09-29 NOTE — Assessment & Plan Note (Signed)
I spent significant time with he and his wife reassuring them this was noncardiac. I think is musculoskeletal and most likely related somewhat to his previous stroke injuries. Also discussed at length the findings on his chest x-ray and chest CT and reassured them that this was not the cause of his discomfort. I suspect atelectasis was from not taking a deep breath with sharp chest pain. I shared my opinion with him. We will put him in for pulmonary referral because of his history of smoking but I suspect there'll be no further need for evaluation. I have cleared him to go back to work. I've advised him to go with diet on his lipids since he cannot afford atorvastatin and his numbers or not that high.

## 2013-09-29 NOTE — Patient Instructions (Signed)
Pt instructed to discontinue Lipitor and start heart healthy diet with exercise Referral placed for pulmonology Need to schedule appt for Financial
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
## 2013-09-29 NOTE — Progress Notes (Signed)
Pt here to f/u with Dr. Daleen SquibbWall for ER chest pain work-up negative C/o left chest wall tightness with sob Need to establish care for pulmonary referral

## 2013-10-21 ENCOUNTER — Ambulatory Visit: Payer: Self-pay | Attending: Internal Medicine

## 2013-10-21 ENCOUNTER — Ambulatory Visit: Payer: Self-pay | Attending: Internal Medicine | Admitting: Internal Medicine

## 2013-10-21 ENCOUNTER — Encounter: Payer: Self-pay | Admitting: Internal Medicine

## 2013-10-21 VITALS — BP 122/82 | HR 66 | Temp 97.9°F | Resp 20 | Ht 71.0 in | Wt 197.8 lb

## 2013-10-21 DIAGNOSIS — R079 Chest pain, unspecified: Secondary | ICD-10-CM

## 2013-10-21 DIAGNOSIS — E785 Hyperlipidemia, unspecified: Secondary | ICD-10-CM

## 2013-10-21 LAB — POCT GLYCOSYLATED HEMOGLOBIN (HGB A1C): Hemoglobin A1C: 5.1

## 2013-10-21 LAB — TSH: TSH: 1.211 u[IU]/mL (ref 0.350–4.500)

## 2013-10-21 NOTE — Progress Notes (Signed)
Pt is here following up on his chest pain, HLD and SOB.

## 2013-10-21 NOTE — Progress Notes (Deleted)
   Subjective:    Patient ID: Frank Soto, male    DOB: 08/20/1970, 43 y.o.   MRN: [id number]  HPI    Review of Systems     Objective:   Physical Exam        Assessment & Plan:

## 2013-10-21 NOTE — Progress Notes (Deleted)
Patient ID: Frank Soto, male   DOB: 05/16/1971, 43 y.o.   MRN: [id number]
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
## 2013-10-21 NOTE — Progress Notes (Signed)
Subjective:     Patient ID: Frank Soto, male   DOB: 01/22/1971, 43 y.o.   MRN: [id number]  HPI Review of Systems Objective:  Physical Exam Assessment:   Plan:  Please refer to my original note for this encounter.

## 2013-10-26 ENCOUNTER — Institutional Professional Consult (permissible substitution): Payer: Self-pay | Admitting: Emergency Medicine

## 2013-10-28 ENCOUNTER — Telehealth: Payer: Self-pay | Admitting: *Deleted

## 2013-10-28 NOTE — Telephone Encounter (Signed)
Message copied by Raynelle CharyWINFREE, Miriya Cloer R on Thu Oct 28, 2013 12:43 PM ------      Message from: Quentin AngstJEGEDE, OLUGBEMIGA E      Created: Tue Oct 26, 2013 11:23 PM       Please inform patient that her thyroid function is normal ------

## 2013-10-28 NOTE — Telephone Encounter (Signed)
Pt is aware of his lab results. 

## 2013-12-03 ENCOUNTER — Institutional Professional Consult (permissible substitution): Payer: Self-pay | Admitting: Emergency Medicine

## 2013-12-23 ENCOUNTER — Encounter (HOSPITAL_COMMUNITY): Payer: Self-pay | Admitting: Emergency Medicine

## 2013-12-23 ENCOUNTER — Emergency Department (HOSPITAL_COMMUNITY)
Admission: EM | Admit: 2013-12-23 | Discharge: 2013-12-23 | Disposition: A | Discharge status: Home / Self Care | Payer: Self-pay | Attending: Emergency Medicine | Admitting: Emergency Medicine

## 2013-12-23 DIAGNOSIS — G43909 Migraine, unspecified, not intractable, without status migrainosus: Secondary | ICD-10-CM | POA: Insufficient documentation

## 2013-12-23 DIAGNOSIS — Z87442 Personal history of urinary calculi: Secondary | ICD-10-CM | POA: Insufficient documentation

## 2013-12-23 DIAGNOSIS — Z87891 Personal history of nicotine dependence: Secondary | ICD-10-CM | POA: Insufficient documentation

## 2013-12-23 DIAGNOSIS — S838X9A Sprain of other specified parts of unspecified knee, initial encounter: Secondary | ICD-10-CM | POA: Insufficient documentation

## 2013-12-23 DIAGNOSIS — X500XXA Overexertion from strenuous movement or load, initial encounter: Secondary | ICD-10-CM | POA: Insufficient documentation

## 2013-12-23 DIAGNOSIS — S86119A Strain of other muscle(s) and tendon(s) of posterior muscle group at lower leg level, unspecified leg, initial encounter: Secondary | ICD-10-CM

## 2013-12-23 DIAGNOSIS — Y929 Unspecified place or not applicable: Secondary | ICD-10-CM | POA: Insufficient documentation

## 2013-12-23 DIAGNOSIS — S86819A Strain of other muscle(s) and tendon(s) at lower leg level, unspecified leg, initial encounter: Principal | ICD-10-CM

## 2013-12-23 DIAGNOSIS — Y9389 Activity, other specified: Secondary | ICD-10-CM | POA: Insufficient documentation

## 2013-12-23 MED ORDER — IBUPROFEN 800 MG PO TABS: 800.0000 mg | ORAL_TABLET | Freq: Three times a day (TID) | ORAL | Status: DC

## 2013-12-23 MED ORDER — KETOROLAC TROMETHAMINE 30 MG/ML IJ SOLN
30.0000 mg | Freq: Once | INTRAMUSCULAR | Status: AC
  Administered 2013-12-23: 30 mg via INTRAMUSCULAR
  Filled 2013-12-23: qty 1

## 2013-12-23 NOTE — ED Provider Notes (Signed)
CSN: [id number]     Arrival date & time 12/23/13  1534 History  This chart was scribed for Ivonne AndrewPeter Brigetta Beckstrom, PA working with Lyanne CoKevin M Campos, MD by Evon Slackerrance Branch, ED Scribe. This patient was seen in room TR09C/TR09C and the patient's care was started at 4:13 PM.      Chief Complaint  Patient presents with  . Leg Pain   Patient is a 43 y.o. male presenting with leg pain. The history is provided by the patient. No language interpreter was used.  Leg Pain  HPI Comments: Lorenza CambridgeBuddy W Hemberger is a 43 y.o. male who presents to the Emergency Department complaining of left sided leg pain onset 2 hours prior. He states that he stepped down and felt something pop near his calf muscle. He states he is not able to bear weight on the leg. He states any movement of the foot worsens his symptoms with pain in the calf. He states that he has associated slight numbness in his foot. He denies any h/o of previous leg injuries to this leg. He denies any other related symptoms. He has not use any medicines or treatment for symptoms. No other aggravating or alleviating factors. No other associated symptoms.   Past Medical History  Diagnosis Date  . Migraine   . Kidney stones    Past Surgical History  Procedure Laterality Date  . Appendectomy    . Cholecystectomy     Family History  Problem Relation Age of Onset  . COPD Mother    History  Substance Use Topics  . Smoking status: Former Games developermoker  . Smokeless tobacco: Not on file  . Alcohol Use: No    Review of Systems  Musculoskeletal: Positive for gait problem.       Left leg pain over medial aspect of calf muscle  Neurological: Positive for numbness.  All other systems reviewed and are negative.  Allergies  Review of patient's allergies indicates no known allergies.  Home Medications   Prior to Admission medications   Medication Sig Start Date End Date Taking? Authorizing Provider  ibuprofen (ADVIL,MOTRIN) 200 MG tablet Take 800 mg by mouth every 6  (six) hours as needed for headache or mild pain.    Historical Provider, MD  promethazine (PHENERGAN) 25 MG tablet Take 25 mg by mouth every 6 (six) hours as needed for nausea or vomiting.    Historical Provider, MD  SUMAtriptan (IMITREX) 100 MG tablet Take 100 mg by mouth every 2 (two) hours as needed for migraine or headache. May repeat in 2 hours if headache persists or recurs.    Historical Provider, MD   Triage Vitals: BP 113/79  Pulse 91  Temp(Src) 98.5 F (36.9 C) (Oral)  Resp 19  Ht 6' (1.829 m)  Wt 190 lb (86.183 kg)  BMI 25.76 kg/m2  SpO2 93%  Physical Exam  Nursing note and vitals reviewed. Constitutional: He is oriented to person, place, and time. He appears well-developed and well-nourished. No distress.  HENT:  Head: Normocephalic and atraumatic.  Eyes: Conjunctivae and EOM are normal.  Neck: Normal range of motion. No tracheal deviation present.  Cardiovascular: Normal rate, regular rhythm and normal heart sounds.   Pulses:      Dorsalis pedis pulses are 2+ on the right side, and 2+ on the left side.  Pulmonary/Chest: Effort normal. No respiratory distress.  Musculoskeletal: Normal range of motion. He exhibits tenderness.  Left leg capillary refill brisk. Normal thompson's test. Tenderness to medial aspect of calf muscle.  Neurological: He is alert and oriented to person, place, and time.  Skin: Skin is warm and dry.  Psychiatric: He has a normal mood and affect. His behavior is normal.    ED Course  Procedures   DIAGNOSTIC STUDIES: Oxygen Saturation is 93% on RA, adequate by my interpretation.    COORDINATION OF CARE: 4:18 PM-Discussed treatment plan which includes Ace wrap and pain medication with pt at bedside and pt agreed to plan.   Patient has normal Thompson test. Good palpation of the kiwis tendon. Do not suspect complete rupture. There is tightness and increased tenderness to the medial gastrocnemius muscle. No bruising of the skin or other skin  changes. At this time suspect muscle tear to the area. Have recommended rest, ice, compression elevation to reduce pain and swelling. Orthopedic referral given.   MDM   Final diagnoses:  Gastrocnemius muscle tear        I personally performed the services described in this documentation, which was scribed in my presence. The recorded information has been reviewed and is accurate.     Angus Seller, PA-C 12/23/13 1701

## 2013-12-23 NOTE — ED Provider Notes (Signed)
Medical screening examination/treatment/procedure(s) were performed by non-physician practitioner and as supervising physician I was immediately available for consultation/collaboration.  Darthula Desa M Drucilla Cumber, MD 12/23/13 1720 

## 2013-12-23 NOTE — ED Notes (Signed)
Pt states he was stepping down a 6 inch drop-off and he heard a "pop" in his L calf.  Initially felt a pain like a cramp.  Now he cannot bear weight d/t pain and L calf is swollen.  Now experiencing pain with any movement of L foot and is beginning to experience numbness of L foot.

## 2013-12-23 NOTE — Discharge Instructions (Signed)
You were seen and evaluated for your left leg and calf pain. At this time your providers feel that you have a muscle tear. Use rest, ice, compression and elevation to reduce pain and swelling. Followup with an orthopedic specialist tomorrow or first thing next week to have further evaluation and treatment. Use the crutches as instructed until you followup with your primary doctor or orthopedic specialist. Take ibuprofen for pain and inflammation.   Muscle Tear A muscle tear is usually caused by over-exertion or stretching. The muscle often takes a while to heal. Muscle tears require 3 to 4 weeks to heal completely. A history of the injury and a physical exam may be performed. Sometimes, the injury is identified with radiographs and an MRI. Treatment for muscle injuries includes:  Resting and protecting the affected area until pain with motion is gone.  Putting ice on the injured area.  Put ice in a bag.  Place a towel between your skin and the bag.  Leave the ice on for 15 to 20 minutes, 3 to 4 times a day.  After two days, you can use heat to relieve spasms.  Using compression wraps to help control swelling and limit movement.  Raising (elevate) the injured area above the level of the heart (if possible) for the first 1 to 2 days after the injury.  Medicine may be prescribed to reduce pain and inflammation. Avoid strenuous activities that bring on muscle pain. Exercises to strengthen and stretch the injured muscle can help heal the muscle and prevent repeated injury. After the pain and swelling are gone, you may begin gradual strengthening exercises. Begin range-of-motion exercises and gentle stretching after 3 to 4 days of rest.  SEEK MEDICAL CARE IF:  Your injured muscle is not improving after 1 week of treatment. Document Released: 08/08/2004 Document Revised: 09/23/2011 Document Reviewed: 01/13/2009 [facility] Patient Information 2014 [place], [place].

## 2013-12-27 ENCOUNTER — Ambulatory Visit: Payer: Self-pay | Attending: Internal Medicine | Admitting: Internal Medicine

## 2013-12-27 ENCOUNTER — Encounter: Payer: Self-pay | Admitting: Internal Medicine

## 2013-12-27 VITALS — BP 123/80 | HR 67 | Temp 98.2°F | Resp 16 | Ht 72.0 in | Wt 193.0 lb

## 2013-12-27 DIAGNOSIS — G43909 Migraine, unspecified, not intractable, without status migrainosus: Secondary | ICD-10-CM | POA: Insufficient documentation

## 2013-12-27 DIAGNOSIS — S8992XA Unspecified injury of left lower leg, initial encounter: Secondary | ICD-10-CM | POA: Insufficient documentation

## 2013-12-27 DIAGNOSIS — M79609 Pain in unspecified limb: Secondary | ICD-10-CM | POA: Insufficient documentation

## 2013-12-27 DIAGNOSIS — S99919A Unspecified injury of unspecified ankle, initial encounter: Secondary | ICD-10-CM

## 2013-12-27 DIAGNOSIS — S99929A Unspecified injury of unspecified foot, initial encounter: Secondary | ICD-10-CM

## 2013-12-27 DIAGNOSIS — S8990XA Unspecified injury of unspecified lower leg, initial encounter: Secondary | ICD-10-CM

## 2013-12-27 NOTE — Progress Notes (Signed)
Pt comes in for left leg evaluation s/p possible muscle tear [facility]. Pt was instructed to f/u with provided orthopedics the next day for further evaluation and MRI, but he didn't schedule appt. Pt has ace wrap dressing on with crutch. States the pain worsens with pressure only. States last week he heard a pop with ambulation. No xrays were done due to needing MRI for poss muscle tear. Pt was prescribed Ibuprofen 800 mg tab as needed but causes GI upset. RICE implemented.

## 2013-12-27 NOTE — Progress Notes (Signed)
Patient ID: Frank Soto, male   DOB: 10/08/1970, 43 y.o.   MRN: [id number]   Frank PanningBuddy Patriarca, is a 43 y.o. male  YQM:[id number]  BMW:[id number]  DOB - 12/20/1970  Chief Complaint  Patient presents with  . Follow-up  . Leg Pain        Subjective:   Frank Soto is a 43 y.o. male here today for a follow up visit. Patient has no significant past medical history except for migraine headaches occasionally. Pt comes in for left leg evaluation s/p possible muscle tear [facility]. Pt was instructed to f/u with PCP and orthopedics the next day for further evaluation and MRI, but he didn't schedule appt.  Pt has ace wrap dressing on with crutch. States the pain worsens with pressure only. States last week he heard a pop with ambulation. No xrays were done due to needing MRI for possible muscle tear. He said he is not able to bear weight on the leg. He states any movement of the foot worsens his symptoms with pain in the calf. He states that he has associated slight numbness in his foot. He denies any h/o of previous leg injuries to this leg. He denies any other related symptom. Pt was prescribed Ibuprofen 800 mg tab as needed but causes GI upset. RICE implemented. Patient has No headache, No chest pain, No abdominal pain - No Nausea, No new weakness tingling or numbness, No Cough - SOB.  No problems updated.  ALLERGIES: No Known Allergies  PAST MEDICAL HISTORY: Past Medical History  Diagnosis Date  . Migraine   . Kidney stones     MEDICATIONS AT HOME: Prior to Admission medications   Medication Sig Start Date End Date Taking? Authorizing Provider  promethazine (PHENERGAN) 25 MG tablet Take 25 mg by mouth every 6 (six) hours as needed for nausea or vomiting.   Yes Historical Provider, MD  SUMAtriptan (IMITREX) 100 MG tablet Take 100 mg by mouth every 2 (two) hours as needed for migraine or headache. May repeat in 2 hours if headache persists or recurs.   Yes Historical  Provider, MD  ibuprofen (ADVIL,MOTRIN) 200 MG tablet Take 800 mg by mouth every 6 (six) hours as needed for headache or mild pain.    Historical Provider, MD  ibuprofen (ADVIL,MOTRIN) 800 MG tablet Take 1 tablet (800 mg total) by mouth 3 (three) times daily. 12/23/13   Angus SellerPeter S Dammen, PA-C     Objective:   Filed Vitals:   12/27/13 1023  BP: 123/80  Pulse: 67  Temp: 98.2 F (36.8 C)  TempSrc: Oral  Resp: 16  Height: 6' (1.829 m)  Weight: 193 lb (87.544 kg)  SpO2: 98%    Exam General appearance : Awake, alert, not in any distress. Speech Clear. Not toxic looking HEENT: Atraumatic and Normocephalic, pupils equally reactive to light and accomodation Neck: supple, no JVD. No cervical lymphadenopathy.  Chest:Good air entry bilaterally, no added sounds  CVS: S1 S2 regular, no murmurs.  Abdomen: Bowel sounds present, Non tender and not distended with no gaurding, rigidity or rebound. Extremities: B/L Lower Ext shows no edema, both legs are warm to touch Neurology: Awake alert, and oriented X 3, CN II-XII intact, Non focal Skin:No Rash Wounds:N/A  Data Review Lab Results  Component Value Date   HGBA1C 5.1 10/21/2013     Assessment & Plan   1. Left leg injury  - MR Tibia Fibula Left W Contrast; Future ordered  Subsequent management will depend  on MRI report  Patient was counseled extensively on nutrition and exercise as tolerated.  The patient was given clear instructions to go to ER or return to medical center if symptoms don't improve, worsen or new problems develop. The patient verbalized understanding. The patient was told to call to get lab results if they haven't heard anything in the next week.   This note has been created with Education officer, environmentalDragon speech recognition software and smart phrase technology. Any transcriptional errors are unintentional.    Jeanann LewandowskyJEGEDE, Zahara Rembert, MD, MHA, FACP, FAAP [facility] [place], [place] [phone]     05/04/2014, 11:28 PM

## 2013-12-28 ENCOUNTER — Ambulatory Visit (HOSPITAL_COMMUNITY)
Admission: RE | Admit: 2013-12-28 | Discharge: 2013-12-28 | Disposition: A | Discharge status: Home / Self Care | Payer: Self-pay | Source: Ambulatory Visit | Attending: Internal Medicine | Admitting: Internal Medicine

## 2013-12-28 ENCOUNTER — Other Ambulatory Visit: Payer: Self-pay | Admitting: Internal Medicine

## 2013-12-28 DIAGNOSIS — Z1389 Encounter for screening for other disorder: Secondary | ICD-10-CM | POA: Insufficient documentation

## 2013-12-28 DIAGNOSIS — Z77018 Contact with and (suspected) exposure to other hazardous metals: Secondary | ICD-10-CM | POA: Insufficient documentation

## 2013-12-28 DIAGNOSIS — S8992XA Unspecified injury of left lower leg, initial encounter: Secondary | ICD-10-CM

## 2013-12-28 DIAGNOSIS — M79609 Pain in unspecified limb: Secondary | ICD-10-CM | POA: Insufficient documentation

## 2013-12-28 MED ORDER — GADOBENATE DIMEGLUMINE 529 MG/ML IV SOLN
18.0000 mL | Freq: Once | INTRAVENOUS | Status: AC | PRN
  Administered 2013-12-28: 18 mL via INTRAVENOUS

## 2013-12-31 ENCOUNTER — Telehealth: Payer: Self-pay | Admitting: *Deleted

## 2013-12-31 NOTE — Telephone Encounter (Signed)
Patient came in person wanting his MRI results interpeted and would also like to know if he can return to work. MRI results were printed off and given to patient by front office staff. Annamaria Hellingose,Jamie Renee, RN

## 2014-01-04 ENCOUNTER — Telehealth: Payer: Self-pay | Admitting: Emergency Medicine

## 2014-01-04 NOTE — Telephone Encounter (Signed)
Pt given negative xray results 

## 2014-01-04 NOTE — Telephone Encounter (Signed)
Pt given MRI results.

## 2014-01-04 NOTE — Telephone Encounter (Signed)
Message copied by Darlis LoanSMITH, JILL D on Tue Jan 04, 2014  8:09 AM ------      Message from: Quentin AngstJEGEDE, OLUGBEMIGA E      Created: Sun Jan 02, 2014  5:11 PM       Please inform patient that the MRI of his left leg shows mild tear in the fascia between 2 muscles. There is no hematoma or inflammation, I don't think there is need for any active intervention as this will easily heal with time ------

## 2014-01-04 NOTE — Telephone Encounter (Signed)
Message copied by Darlis LoanSMITH, JILL D on Tue Jan 04, 2014  8:07 AM ------      Message from: Quentin AngstJEGEDE, OLUGBEMIGA E      Created: Sun Jan 02, 2014  4:50 PM       Please inform patient that his x-ray shows no evidence of metallic foreign body within the eyes/orbits ------

## 2014-01-07 ENCOUNTER — Telehealth: Payer: Self-pay | Admitting: Internal Medicine

## 2014-01-19 ENCOUNTER — Other Ambulatory Visit: Payer: Self-pay | Admitting: *Deleted

## 2014-01-19 DIAGNOSIS — Z0000 Encounter for general adult medical examination without abnormal findings: Secondary | ICD-10-CM

## 2014-01-19 NOTE — Telephone Encounter (Signed)
I placed the referral and left a message for the pt.

## 2014-01-19 NOTE — Telephone Encounter (Signed)
I place the referral and left message with pt.

## 2014-04-21 ENCOUNTER — Ambulatory Visit: Payer: Self-pay | Admitting: Internal Medicine

## 2014-06-23 ENCOUNTER — Encounter (HOSPITAL_COMMUNITY): Payer: Self-pay | Admitting: Cardiology

## 2014-08-09 ENCOUNTER — Encounter (HOSPITAL_COMMUNITY): Payer: Self-pay | Admitting: Emergency Medicine

## 2014-08-09 ENCOUNTER — Emergency Department (HOSPITAL_COMMUNITY)
Admission: EM | Admit: 2014-08-09 | Discharge: 2014-08-09 | Disposition: A | Discharge status: Home / Self Care | Payer: Self-pay | Attending: Emergency Medicine | Admitting: Emergency Medicine

## 2014-08-09 ENCOUNTER — Emergency Department (HOSPITAL_COMMUNITY): Payer: Self-pay

## 2014-08-09 DIAGNOSIS — Y998 Other external cause status: Secondary | ICD-10-CM | POA: Insufficient documentation

## 2014-08-09 DIAGNOSIS — Z87442 Personal history of urinary calculi: Secondary | ICD-10-CM | POA: Insufficient documentation

## 2014-08-09 DIAGNOSIS — Y9389 Activity, other specified: Secondary | ICD-10-CM | POA: Insufficient documentation

## 2014-08-09 DIAGNOSIS — G43909 Migraine, unspecified, not intractable, without status migrainosus: Secondary | ICD-10-CM | POA: Insufficient documentation

## 2014-08-09 DIAGNOSIS — Z87891 Personal history of nicotine dependence: Secondary | ICD-10-CM | POA: Insufficient documentation

## 2014-08-09 DIAGNOSIS — Z791 Long term (current) use of non-steroidal anti-inflammatories (NSAID): Secondary | ICD-10-CM | POA: Insufficient documentation

## 2014-08-09 DIAGNOSIS — S92424A Nondisplaced fracture of distal phalanx of right great toe, initial encounter for closed fracture: Secondary | ICD-10-CM | POA: Insufficient documentation

## 2014-08-09 DIAGNOSIS — W208XXA Other cause of strike by thrown, projected or falling object, initial encounter: Secondary | ICD-10-CM | POA: Insufficient documentation

## 2014-08-09 DIAGNOSIS — Y9289 Other specified places as the place of occurrence of the external cause: Secondary | ICD-10-CM | POA: Insufficient documentation

## 2014-08-09 DIAGNOSIS — Z23 Encounter for immunization: Secondary | ICD-10-CM | POA: Insufficient documentation

## 2014-08-09 DIAGNOSIS — S92911A Unspecified fracture of right toe(s), initial encounter for closed fracture: Secondary | ICD-10-CM

## 2014-08-09 MED ORDER — OXYCODONE-ACETAMINOPHEN 5-325 MG PO TABS
1.0000 | ORAL_TABLET | Freq: Once | ORAL | Status: AC
  Administered 2014-08-09: 1 via ORAL
  Filled 2014-08-09: qty 1

## 2014-08-09 MED ORDER — TETANUS-DIPHTH-ACELL PERTUSSIS 5-2.5-18.5 LF-MCG/0.5 IM SUSP
0.5000 mL | Freq: Once | INTRAMUSCULAR | Status: AC
  Administered 2014-08-09: 0.5 mL via INTRAMUSCULAR
  Filled 2014-08-09: qty 0.5

## 2014-08-09 MED ORDER — OXYCODONE-ACETAMINOPHEN 5-325 MG PO TABS
2.0000 | ORAL_TABLET | Freq: Four times a day (QID) | ORAL | Status: DC | PRN
Start: 2014-08-09 — End: 2015-11-28

## 2014-08-09 NOTE — ED Notes (Signed)
Declined W/C at D/C and was escorted to lobby by RN.Declined W/C at D/C and was escorted to lobby by RN. 

## 2014-08-09 NOTE — ED Notes (Signed)
Patient transported to X-ray 

## 2014-08-09 NOTE — Discharge Instructions (Signed)
Toe Fracture  Your caregiver has diagnosed you as having a fractured toe. A toe fracture is a break in the bone of a toe. "Bevin taping" is a way of splinting your broken toe, by taping the broken toe to the toe next to it. This "Asir taping" will keep the injured toe from moving beyond normal range of motion. Phyllip taping also helps the toe heal in a more normal alignment. It may take 6 to 8 weeks for the toe injury to heal.  HOME CARE INSTRUCTIONS   · Leave your toes taped together for as long as directed by your caregiver or until you see a doctor for a follow-up examination. You can change the tape after bathing. Always use a small piece of gauze or cotton between the toes when taping them together. This will help the skin stay dry and prevent infection.  · Apply ice to the injury for 15-20 minutes each hour while awake for the first 2 days. Put the ice in a plastic bag and place a towel between the bag of ice and your skin.  · After the first 2 days, apply heat to the injured area. Use heat for the next 2 to 3 days. Place a heating pad on the foot or soak the foot in warm water as directed by your caregiver.  · Keep your foot elevated as much as possible to lessen swelling.  · Wear sturdy, supportive shoes. The shoes should not pinch the toes or fit tightly against the toes.  · Your caregiver may prescribe a rigid shoe if your foot is very swollen.  · Your may be given crutches if the pain is too great and it hurts too much to walk.  · Only take over-the-counter or prescription medicines for pain, discomfort, or fever as directed by your caregiver.  · If your caregiver has given you a follow-up appointment, it is very important to keep that appointment. Not keeping the appointment could result in a chronic or permanent injury, pain, and disability. If there is any problem keeping the appointment, you must call back to this facility for assistance.  SEEK MEDICAL CARE IF:   · You have increased pain or swelling,  not relieved with medications.  · The pain does not get better after 1 week.  · Your injured toe is cold when the others are warm.  SEEK IMMEDIATE MEDICAL CARE IF:   · The toe becomes cold, numb, or white.  · The toe becomes hot (inflamed) and red.  Document Released: 06/28/2000 Document Revised: 09/23/2011 Document Reviewed: 02/15/2008  ExitCare® Patient Information ©2015 [employer]. This information is not intended to replace advice given to you by your health care provider. Make sure you discuss any questions you have with your health care provider.

## 2014-08-09 NOTE — ED Notes (Signed)
The patient said a 200lb pipe fell on his right, large toe.  He said it was on a piece of pipe about 33ft tall and it slipped and fell on his toe.  He rates his pain 10/10.

## 2014-08-09 NOTE — ED Provider Notes (Signed)
CSN: [id number]     Arrival date & time 08/09/14  2137 History   First MD Initiated Contact with Patient 08/09/14 2203     This chart was scribed for non-physician practitioner, Roxy Horsemanobert Sicily Zaragoza PA-C working with Purvis SheffieldForrest Harrison, MD by Arlan OrganAshley Leger, ED Scribe. This patient was seen in room TR05C/TR05C and the patient's care was started at 10:57 PM.   Chief Complaint  Patient presents with  . Toe Injury    The patient said a 200lb pipe fell on his right, large toe.  He said it was on a piece of pipe about 403ft tall and it slipped and fell on his toe.   HPI  HPI Comments: Frank Soto is a 44 y.o. male who presents to the Emergency Department complaining of a toe injury sustained just prior to arival. Pt states a 200 pound pipe fell on his R great toe. He now c/o constant, moderate pain the toe. Currently he rates pain 6/10 at rest and 9/10 with palpation but he is able to ambulate with discomfort. Pain is exacerbated with palpation and certain movements. He has not tried any OTC medications or home remedies to help manage symptoms. No numbness or loss of sensation to toe. Last Tetanus shot approximately 5 years ago. No known allergies to medications.  Past Medical History  Diagnosis Date  . Migraine   . Kidney stones    Past Surgical History  Procedure Laterality Date  . Appendectomy    . Cholecystectomy    . Left heart catheterization with coronary angiogram N/A 09/27/2013    Procedure: LEFT HEART CATHETERIZATION WITH CORONARY ANGIOGRAM;  Surgeon: Peter M SwazilandJordan, MD;  [facility];  Service: Cardiovascular;  Laterality: N/A;   Family History  Problem Relation Age of Onset  . COPD Mother    History  Substance Use Topics  . Smoking status: Former Games developermoker  . Smokeless tobacco: Not on file  . Alcohol Use: No    Review of Systems  Constitutional: Negative for fever and chills.  Musculoskeletal: Positive for arthralgias. Negative for joint swelling.      Allergies   Review of patient's allergies indicates no known allergies.  Home Medications   Prior to Admission medications   Medication Sig Start Date End Date Taking? Authorizing Provider  ibuprofen (ADVIL,MOTRIN) 200 MG tablet Take 800 mg by mouth every 6 (six) hours as needed for headache or mild pain.    Historical Provider, MD  ibuprofen (ADVIL,MOTRIN) 800 MG tablet Take 1 tablet (800 mg total) by mouth 3 (three) times daily. 12/23/13   Phill MutterPeter S Dammen, PA-C  promethazine (PHENERGAN) 25 MG tablet Take 25 mg by mouth every 6 (six) hours as needed for nausea or vomiting.    Historical Provider, MD  SUMAtriptan (IMITREX) 100 MG tablet Take 100 mg by mouth every 2 (two) hours as needed for migraine or headache. May repeat in 2 hours if headache persists or recurs.    Historical Provider, MD   Triage Vitals: BP 117/77 mmHg  Pulse 111  Temp(Src) 98.9 F (37.2 C) (Oral)  Resp 20  SpO2 98%   Physical Exam  Constitutional: He is oriented to person, place, and time. He appears well-developed and well-nourished.  HENT:  Head: Normocephalic and atraumatic.  Eyes: Conjunctivae and EOM are normal.  Neck: Normal range of motion.  Cardiovascular: Normal rate and intact distal pulses.   Pulmonary/Chest: Effort normal.  Abdominal: He exhibits no distension.  Musculoskeletal: Normal range of motion.  Right great  toe very tender to palpation, no bony abnormality or deformity  Neurological: He is alert and oriented to person, place, and time.  Sensation intact  Skin: Skin is dry.  Diffuse ecchymosis of the right great toe sparing the toenail, mild anterior abrasion, no laceration  Psychiatric: He has a normal mood and affect. His behavior is normal. Judgment and thought content normal.  Nursing note and vitals reviewed.   ED Course  Procedures (including critical care time)  DIAGNOSTIC STUDIES: Oxygen Saturation is 98% on RA, Normal by my interpretation.    COORDINATION OF CARE: 10:57 PM- Will give  Percocet. Will order DG foot complete R. Will place pt in post-op shoe. Discussed treatment plan with pt at bedside and pt agreed to plan.     Labs Review Labs Reviewed - No data to display  Imaging Review Dg Foot Complete Right  08/09/2014   CLINICAL DATA:  Toe injury, heavy pipe fell on his right great toe  EXAM: RIGHT FOOT COMPLETE - 3+ VIEW  COMPARISON:  None.  FINDINGS: Three views of the right foot submitted. There is nondisplaced probable comminuted fracture in distal phalanx great toe.  IMPRESSION: Nondisplaced probable comminuted fracture distal phalanx right great toe.   Electronically Signed   By: Natasha Mead M.D.   On: 08/09/2014 22:45   Images reviewed in the PACS system by me personally, I agree with radiologist's impression.   EKG Interpretation None      MDM   Final diagnoses:  Toe fracture, right, closed, initial encounter    Patient with right distal great toe fracture. Does not appear to be open. Will give the patient pain medicine, update tetanus shot for skin abrasion, and Leanord tape and use postop shoe. Follow-up with primary care provider. Patient stands and agrees with the plan. He is stable and ready for discharge. I personally performed the services described in this documentation, which was scribed in my presence. The recorded information has been reviewed and is accurate.    Roxy Horseman, PA-C 08/09/14 2308  Purvis Sheffield, MD 08/10/14 [phone]

## 2015-05-01 ENCOUNTER — Emergency Department (HOSPITAL_COMMUNITY): Payer: Self-pay

## 2015-05-01 ENCOUNTER — Emergency Department (HOSPITAL_COMMUNITY)
Admission: EM | Admit: 2015-05-01 | Discharge: 2015-05-01 | Disposition: A | Discharge status: Home / Self Care | Payer: Self-pay | Attending: Emergency Medicine | Admitting: Emergency Medicine

## 2015-05-01 ENCOUNTER — Encounter (HOSPITAL_COMMUNITY): Payer: Self-pay | Admitting: Family Medicine

## 2015-05-01 DIAGNOSIS — G43909 Migraine, unspecified, not intractable, without status migrainosus: Secondary | ICD-10-CM | POA: Insufficient documentation

## 2015-05-01 DIAGNOSIS — Z87891 Personal history of nicotine dependence: Secondary | ICD-10-CM | POA: Insufficient documentation

## 2015-05-01 DIAGNOSIS — Z87442 Personal history of urinary calculi: Secondary | ICD-10-CM | POA: Insufficient documentation

## 2015-05-01 DIAGNOSIS — R0602 Shortness of breath: Secondary | ICD-10-CM | POA: Insufficient documentation

## 2015-05-01 DIAGNOSIS — Z79899 Other long term (current) drug therapy: Secondary | ICD-10-CM | POA: Insufficient documentation

## 2015-05-01 DIAGNOSIS — R079 Chest pain, unspecified: Secondary | ICD-10-CM | POA: Insufficient documentation

## 2015-05-01 DIAGNOSIS — M1990 Unspecified osteoarthritis, unspecified site: Secondary | ICD-10-CM | POA: Insufficient documentation

## 2015-05-01 HISTORY — DX: Unspecified osteoarthritis, unspecified site: M19.90

## 2015-05-01 LAB — CBC
HEMATOCRIT: 46.3 % (ref 39.0–52.0)
Hemoglobin: 16.4 g/dL (ref 13.0–17.0)
MCH: 29.2 pg (ref 26.0–34.0)
MCHC: 35.4 g/dL (ref 30.0–36.0)
MCV: 82.4 fL (ref 78.0–100.0)
Platelets: 255 10*3/uL (ref 150–400)
RBC: 5.62 MIL/uL (ref 4.22–5.81)
RDW: 12.9 % (ref 11.5–15.5)
WBC: 9.8 10*3/uL (ref 4.0–10.5)

## 2015-05-01 LAB — TROPONIN I: Troponin I: <0.03 ng/mL (ref <0.031)

## 2015-05-01 LAB — BASIC METABOLIC PANEL
Anion gap: 10 (ref 5–15)
BUN: 16 mg/dL (ref 6–20)
CHLORIDE: 106 mmol/L (ref 101–111)
CO2: 20 mmol/L — ABNORMAL LOW (ref 22–32)
Calcium: 9.6 mg/dL (ref 8.9–10.3)
Creatinine, Ser: 0.93 mg/dL (ref 0.61–1.24)
GFR calc Af Amer: >60 mL/min (ref >60)
Glucose, Bld: 92 mg/dL (ref 65–99)
Potassium: 3.8 mmol/L (ref 3.5–5.1)
Sodium: 136 mmol/L (ref 135–145)

## 2015-05-01 LAB — I-STAT TROPONIN, ED: Troponin i, poc: 0 ng/mL (ref 0.00–0.08)

## 2015-05-01 MED ORDER — KETOROLAC TROMETHAMINE 30 MG/ML IJ SOLN
30.0000 mg | Freq: Once | INTRAMUSCULAR | Status: AC
  Administered 2015-05-01: 30 mg via INTRAMUSCULAR
  Filled 2015-05-01: qty 1

## 2015-05-01 NOTE — ED Provider Notes (Signed)
CSN: [id number]     Arrival date & time 05/01/15  1651 History   First MD Initiated Contact with Patient 05/01/15 2005     Chief Complaint  Patient presents with  . Chest Pain  . Shortness of Breath   HPI   44 year old male presents today with chest pain. Patient reports that on Thursday ( 4 days ago) he began having chest "soreness". He reports this pain was made worse with deep inspiration, palpation of the chest, movements of the upper torso. He reports that he also had some abdominal discomfort, similar to previous GERD type symptoms. He reports taking proton pump inhibitor at that time which improve symptoms. He reports the soreness continued, was intermittent, had not worsened. He reports that starting this morning he started having shortness of breath, which she describes this "pain causing difficulty breathing". He reports that he does not have any shortness of breath at rest, he does not always have the shortness of breath with ambulation, mostly associated with the chest pain. Patient reports he is a former smoker, not currently. Patient denies fever, chills, cough, lower extremity swelling or edema, diaphoresis, nausea, vomiting, prolonged immobilization, recent trauma or surgery, active malignancy, history of DVT or PE. Patient had similar symptoms for numerous years , he was seen in the emergency room in 2015 with similar presentation including shortness of breath. At that time he was admitted to the hospital for cardiac workup he received stress test which was abnormal followed by heart cath which showed no occlusion. He received a CT PE study showing no pulmonary embolism.   Past Medical History  Diagnosis Date  . Migraine   . Kidney stones   . Arthritis    Past Surgical History  Procedure Laterality Date  . Appendectomy    . Cholecystectomy    . Left heart catheterization with coronary angiogram N/A 09/27/2013    Procedure: LEFT HEART CATHETERIZATION WITH CORONARY ANGIOGRAM;   Surgeon: Peter M Swaziland, MD;  Location: [facility] CATH LAB;  Service: Cardiovascular;  Laterality: N/A;   Family History  Problem Relation Age of Onset  . COPD Mother    Social History  Substance Use Topics  . Smoking status: Former Games developer  . Smokeless tobacco: None  . Alcohol Use: No     Review of Systems  All other systems reviewed and are negative.    Allergies  Review of patient's allergies indicates no known allergies.  Home Medications   Prior to Admission medications   Medication Sig Start Date End Date Taking? Authorizing Provider  ibuprofen (ADVIL,MOTRIN) 200 MG tablet Take 400 mg by mouth every 6 (six) hours as needed for headache.   Yes Historical Provider, MD  sertraline (ZOLOFT) 100 MG tablet Take 150 mg by mouth at bedtime.   Yes Historical Provider, MD  SUMAtriptan (IMITREX) 100 MG tablet Take 100 mg by mouth every 2 (two) hours as needed for migraine. May repeat in 2 hours if headache persists or recurs.   Yes Historical Provider, MD  traZODone (DESYREL) 50 MG tablet Take 50-100 mg by mouth at bedtime.   Yes Historical Provider, MD  ibuprofen (ADVIL,MOTRIN) 800 MG tablet Take 1 tablet (800 mg total) by mouth 3 (three) times daily. 12/23/13   Ivonne Andrew, PA-C  oxyCODONE-acetaminophen (PERCOCET/ROXICET) 5-325 MG per tablet Take 2 tablets by mouth every 6 (six) hours as needed for severe pain. 08/09/14   Roxy Horseman, PA-C   BP 122/84 mmHg  Pulse 67  Temp(Src) 97.6 F (36.4 C)  Resp 13  SpO2 96%     Physical Exam  Constitutional: He is oriented to person, place, and time. He appears well-developed and well-nourished.  HENT:  Head: Normocephalic and atraumatic.  Eyes: Conjunctivae are normal. Pupils are equal, round, and reactive to light. Right eye exhibits no discharge. Left eye exhibits no discharge. No scleral icterus.  Neck: Normal range of motion. No JVD present. No tracheal deviation present.  Cardiovascular: Normal rate, regular rhythm, normal heart sounds  and intact distal pulses.  Exam reveals no gallop and no friction rub.   No murmur heard. Pulmonary/Chest: Breath sounds normal. No stridor. No respiratory distress. He has no wheezes. He has no rales. He exhibits no tenderness.  Deformity of chest wall and sternum, no signs of rash, recent trauma.  Abdominal: Soft. He exhibits no distension and no mass. There is no tenderness. There is no rebound and no guarding.  Musculoskeletal: Normal range of motion. He exhibits no edema or tenderness.  Neurological: He is alert and oriented to person, place, and time. Coordination normal.  Skin: Skin is warm and dry. No rash noted. No erythema. No pallor.  Psychiatric: He has a normal mood and affect. His behavior is normal. Judgment and thought content normal.  Nursing note and vitals reviewed.   ED Course  Procedures (including critical care time) Labs Review Labs Reviewed  BASIC METABOLIC PANEL - Abnormal; Notable for the following:    CO2 20 (*)    All other components within normal limits  CBC  TROPONIN I  I-STAT TROPOININ, ED    Imaging Review Dg Chest 2 View  05/01/2015  CLINICAL DATA:  Chest tightness with shortness of breath and pain EXAM: CHEST - 2 VIEW COMPARISON:  09/23/2013 FINDINGS: The heart size and mediastinal contours are within normal limits. Both lungs are clear. The visualized skeletal structures are unremarkable. IMPRESSION: No active disease. Electronically Signed   By: Alcide CleverMark  Lukens M.D.   On: 05/01/2015 17:42   I have personally reviewed and evaluated these images and lab results as part of my medical decision-making.   EKG Interpretation   Date/Time:  Monday May 01 2015 21:57:06 EDT Ventricular Rate:  69 PR Interval:  139 QRS Duration: 110 QT Interval:  403 QTC Calculation: 432 R Axis:   -7 Text Interpretation:  Sinus rhythm RSR' in V1 or V2, right VCD or RVH  since last tracing no significant change Confirmed by BELFI  MD, MELANIE  ([id number]) on 05/01/2015  11:26:18 PM      MDM   Final diagnoses:  Chest pain, unspecified chest pain type    Labs: Troponin 2, BMP, CBC- no significant findings  Imaging: DG chest no significant findings  Consults:  Therapeutics: Toradol  Discharge Meds:   Assessment/Plan: Patient presents with chest pain and shortness of breath. This seemed to be chronic in nature, patient had been seen with inpatient management with CT angiogram chest PE, abnormal stress tests, followed with normal echo, normal catheterization. Patient presents with chest wall pain, he is tender to palpation chest wall, he reports this is the pain that he is feeling. Patient also may have a gastric component as he had some epigastric discomfort that radiated up into his chest, this was relieved with oral medications today. Patient was given a dose of Toradol here in the ED while tests were pending, he reports significant improvement in his chest pain. Patient has a heart score of 1, 2 negative troponins, and is perk negative. Low suspicion for pulmonary  or ACS. Patient is instructed to follow-up with his primary care provider tomorrow, contact cardiologist for repeat evaluation this week. Strict return precautions given, patient verbalized understanding to today's plan.         Eyvonne Mechanic, PA-C 05/03/15 1333  Rolan Bucco, MD 05/03/15 2122

## 2015-05-01 NOTE — Discharge Instructions (Signed)
Chest Wall Pain Chest wall pain is pain in or around the bones and muscles of your chest. Sometimes, an injury causes this pain. Sometimes, the cause may not be known. This pain may take several weeks or longer to get better. HOME CARE Pay attention to any changes in your symptoms. Take these actions to help with your pain:  Rest as told by your doctor.  Avoid activities that cause pain. Try not to use your chest, belly (abdominal), or side muscles to lift heavy things.  If directed, apply ice to the painful area:  Put ice in a plastic bag.  Place a towel between your skin and the bag.  Leave the ice on for 20 minutes, 2-3 times per day.  Take over-the-counter and prescription medicines only as told by your doctor.  Do not use tobacco products, including cigarettes, chewing tobacco, and e-cigarettes. If you need help quitting, ask your doctor.  Keep all follow-up visits as told by your doctor. This is important. GET HELP IF:  You have a fever.  Your chest pain gets worse.  You have new symptoms. GET HELP RIGHT AWAY IF:  You feel sick to your stomach (nauseous) or you throw up (vomit).  You feel sweaty or light-headed.  You have a cough with phlegm (sputum) or you cough up blood.  You are short of breath.   This information is not intended to replace advice given to you by your health care provider. Make sure you discuss any questions you have with your health care provider.   Document Released: 12/18/2007 Document Revised: 03/22/2015 Document Reviewed: 09/26/2014 Elsevier Interactive Patient Education [employer]2016 Elsevier Inc.   Please follow-up with her primary care provider tomorrow for reevaluation further management. If new or worsening signs or symptoms present please return immediately. Please use Tylenol or ibuprofen as needed for pain. Please contact her cardiologist and schedule follow-up evaluation this week.

## 2015-05-01 NOTE — ED Notes (Signed)
Pt here for chest pain, SOB. sts started Saturday and he thought it was reflux. sts meds aren't helping and now SOB.

## 2015-05-01 NOTE — ED Notes (Signed)
Pt. Left with all belongings and refused wheelchair. Discharge instructions were reviewed and all questions were answered.  

## 2015-11-28 ENCOUNTER — Emergency Department (HOSPITAL_COMMUNITY): Payer: Self-pay

## 2015-11-28 ENCOUNTER — Encounter (HOSPITAL_COMMUNITY): Payer: Self-pay | Admitting: Nurse Practitioner

## 2015-11-28 ENCOUNTER — Emergency Department (HOSPITAL_COMMUNITY)
Admission: EM | Admit: 2015-11-28 | Discharge: 2015-11-28 | Disposition: A | Discharge status: Home / Self Care | Payer: Self-pay | Attending: Emergency Medicine | Admitting: Emergency Medicine

## 2015-11-28 DIAGNOSIS — R300 Dysuria: Secondary | ICD-10-CM | POA: Insufficient documentation

## 2015-11-28 DIAGNOSIS — M1990 Unspecified osteoarthritis, unspecified site: Secondary | ICD-10-CM | POA: Insufficient documentation

## 2015-11-28 DIAGNOSIS — Z87891 Personal history of nicotine dependence: Secondary | ICD-10-CM | POA: Insufficient documentation

## 2015-11-28 DIAGNOSIS — N50812 Left testicular pain: Secondary | ICD-10-CM | POA: Insufficient documentation

## 2015-11-28 DIAGNOSIS — Z79899 Other long term (current) drug therapy: Secondary | ICD-10-CM | POA: Insufficient documentation

## 2015-11-28 DIAGNOSIS — R110 Nausea: Secondary | ICD-10-CM | POA: Insufficient documentation

## 2015-11-28 DIAGNOSIS — M545 Low back pain: Secondary | ICD-10-CM | POA: Insufficient documentation

## 2015-11-28 DIAGNOSIS — Z87442 Personal history of urinary calculi: Secondary | ICD-10-CM | POA: Insufficient documentation

## 2015-11-28 LAB — URINALYSIS, ROUTINE W REFLEX MICROSCOPIC
BILIRUBIN URINE: NEGATIVE
Glucose, UA: NEGATIVE mg/dL
Hgb urine dipstick: NEGATIVE
KETONES UR: NEGATIVE mg/dL
LEUKOCYTES UA: NEGATIVE
NITRITE: NEGATIVE
PH: 7 (ref 5.0–8.0)
PROTEIN: NEGATIVE mg/dL
Specific Gravity, Urine: 1.026 (ref 1.005–1.030)

## 2015-11-28 LAB — CBC WITH DIFFERENTIAL/PLATELET
BASOS PCT: 0 %
Basophils Absolute: 0 10*3/uL (ref 0.0–0.1)
EOS ABS: 0.1 10*3/uL (ref 0.0–0.7)
Eosinophils Relative: 1 %
HEMATOCRIT: 46.6 % (ref 39.0–52.0)
Hemoglobin: 16.5 g/dL (ref 13.0–17.0)
Lymphocytes Relative: 34 %
Lymphs Abs: 3.3 10*3/uL (ref 0.7–4.0)
MCH: 29.1 pg (ref 26.0–34.0)
MCHC: 35.4 g/dL (ref 30.0–36.0)
MCV: 82.2 fL (ref 78.0–100.0)
MONO ABS: 0.8 10*3/uL (ref 0.1–1.0)
MONOS PCT: 8 %
NEUTROS ABS: 5.5 10*3/uL (ref 1.7–7.7)
NEUTROS PCT: 57 %
Platelets: 271 10*3/uL (ref 150–400)
RBC: 5.67 MIL/uL (ref 4.22–5.81)
RDW: 12.9 % (ref 11.5–15.5)
WBC: 9.6 10*3/uL (ref 4.0–10.5)

## 2015-11-28 LAB — BASIC METABOLIC PANEL
ANION GAP: 10 (ref 5–15)
BUN: 18 mg/dL (ref 6–20)
CALCIUM: 9.4 mg/dL (ref 8.9–10.3)
CO2: 24 mmol/L (ref 22–32)
CREATININE: 1.04 mg/dL (ref 0.61–1.24)
Chloride: 102 mmol/L (ref 101–111)
Glucose, Bld: 96 mg/dL (ref 65–99)
Potassium: 3.6 mmol/L (ref 3.5–5.1)
Sodium: 136 mmol/L (ref 135–145)

## 2015-11-28 MED ORDER — KETOROLAC TROMETHAMINE 30 MG/ML IJ SOLN
30.0000 mg | Freq: Once | INTRAMUSCULAR | Status: AC
  Administered 2015-11-28: 30 mg via INTRAVENOUS
  Filled 2015-11-28: qty 1

## 2015-11-28 MED ORDER — IBUPROFEN 200 MG PO TABS: 600.0000 mg | ORAL_TABLET | Freq: Four times a day (QID) | ORAL | Status: DC | PRN

## 2015-11-28 MED ORDER — HYDROCODONE-ACETAMINOPHEN 5-325 MG PO TABS: 2.0000 | ORAL_TABLET | ORAL | Status: DC | PRN

## 2015-11-28 MED ORDER — MORPHINE SULFATE (PF) 4 MG/ML IV SOLN
4.0000 mg | Freq: Once | INTRAVENOUS | Status: AC
  Administered 2015-11-28: 4 mg via INTRAVENOUS
  Filled 2015-11-28: qty 1

## 2015-11-28 MED ORDER — ONDANSETRON HCL 4 MG/2ML IJ SOLN
4.0000 mg | Freq: Once | INTRAMUSCULAR | Status: AC
  Administered 2015-11-28: 4 mg via INTRAVENOUS
  Filled 2015-11-28: qty 2

## 2015-11-28 MED ORDER — MORPHINE SULFATE (PF) 4 MG/ML IV SOLN: 4.0000 mg | Freq: Once | INTRAVENOUS | Status: DC

## 2015-11-28 NOTE — ED Provider Notes (Signed)
CSN: [id number]     Arrival date & time 11/28/15  1537 History   First MD Initiated Contact with Patient 11/28/15 1752     Chief Complaint  Patient presents with  . Testicle Pain     (Consider location/radiation/quality/duration/timing/severity/associated sxs/prior Treatment) HPI   Patient is a 45 year old male with a history of migraines and depression presents the emergency department with left testicular pain since 8:30 AM today. He states the pain was initially dull, 2/10, and progressively has gotten worse to a sharp/stabbing, constant, 10/10 pain that radiates to his lower back. Pain in lower back is more dull in nature. Associated swelling of left testicle, dysuria that is worse at the end of the stream, nausea. Patient denies hematuria, penile discharge, changes in bowel habits, hematochezia, vomiting, abdominal pain, trauma to his testicle, fever, chills. He has not taken anything for the pain. Nothing makes it better, touching it makes it worse. Patient states he has not had sex in a year and no history of STDs. He states his left testicle is significantly smaller than his right which he attributes to a traumatic event to his scrotum when he was a child. He was never seen or treated for this event.  Past Medical History  Diagnosis Date  . Migraine   . Kidney stones   . Arthritis    Past Surgical History  Procedure Laterality Date  . Appendectomy    . Cholecystectomy    . Left heart catheterization with coronary angiogram N/A 09/27/2013    Procedure: LEFT HEART CATHETERIZATION WITH CORONARY ANGIOGRAM;  Surgeon: Peter M SwazilandJordan, MD;  [facility];  Service: Cardiovascular;  Laterality: N/A;   Family History  Problem Relation Age of Onset  . COPD Mother    Social History  Substance Use Topics  . Smoking status: Former Games developermoker  . Smokeless tobacco: None  . Alcohol Use: No    Review of Systems  Constitutional: Negative for fever and chills.  Eyes: Negative for  visual disturbance.  Respiratory: Negative for chest tightness and shortness of breath.   Cardiovascular: Negative for chest pain and leg swelling.  Gastrointestinal: Positive for nausea. Negative for vomiting, abdominal pain, diarrhea, constipation, blood in stool and abdominal distention.  Genitourinary: Positive for dysuria, scrotal swelling and testicular pain. Negative for hematuria, flank pain, discharge, penile swelling and penile pain.  Musculoskeletal: Positive for back pain. Negative for myalgias and arthralgias.  Skin: Negative for rash.  Neurological: Negative for dizziness, syncope, weakness and headaches.  Psychiatric/Behavioral: Negative for confusion and agitation.      Allergies  Review of patient's allergies indicates no known allergies.  Home Medications   Prior to Admission medications   Medication Sig Start Date End Date Taking? Authorizing Provider  sertraline (ZOLOFT) 100 MG tablet Take 150 mg by mouth at bedtime.   Yes Historical Provider, MD  SUMAtriptan (IMITREX) 100 MG tablet Take 100 mg by mouth every 2 (two) hours as needed for migraine. May repeat in 2 hours if headache persists or recurs.   Yes Historical Provider, MD  traZODone (DESYREL) 50 MG tablet Take 50-100 mg by mouth at bedtime.   Yes Historical Provider, MD  HYDROcodone-acetaminophen (NORCO/VICODIN) 5-325 MG tablet Take 2 tablets by mouth every 4 (four) hours as needed. 11/28/15   Jerre SimonJessica L Focht, PA  ibuprofen (ADVIL,MOTRIN) 200 MG tablet Take 3 tablets (600 mg total) by mouth every 6 (six) hours as needed for headache or moderate pain. 11/28/15   Jerre SimonJessica L Focht, PA  BP 112/75 mmHg  Pulse 98  Temp(Src) 98.6 F (37 C) (Oral)  Resp 18  SpO2 98% Physical Exam  Constitutional: He appears well-developed and well-nourished. No distress.  HENT:  Head: Normocephalic and atraumatic.  Eyes: Conjunctivae are normal.  Neck: Normal range of motion.  Cardiovascular: Normal rate and normal heart sounds.   Exam reveals no gallop and no friction rub.   No murmur heard. Pulmonary/Chest: Effort normal and breath sounds normal.  Abdominal: Soft. Bowel sounds are normal. He exhibits no distension.  Mild TTP to left lower quadrant and suprapubic region, no rebound or guarding  Genitourinary: Penis normal. Right testis shows no swelling and no tenderness. Left testis shows tenderness. Circumcised. No penile erythema or penile tenderness. No discharge found.  No inguinal hernias appreciated on exam, left testes significantly smaller than right, no masses or swelling appreciated,   Musculoskeletal: Normal range of motion. He exhibits no edema.  Neurological: He is alert. Coordination normal.  Skin: Skin is warm and dry. No rash noted.  Psychiatric: He has a normal mood and affect. His behavior is normal.    ED Course  Procedures (including critical care time) Labs Review Labs Reviewed  URINE CULTURE  URINALYSIS, ROUTINE W REFLEX MICROSCOPIC (NOT AT [facility])  BASIC METABOLIC PANEL  CBC WITH DIFFERENTIAL/PLATELET  GC/CHLAMYDIA PROBE AMP ([place]) NOT AT [facility]    Imaging Review US Scrotum  11/28/2015  CLINICAL DATA:  45 year old male with left testicular pain EXAM: SCROTAL ULTRASOUND DOPPLER ULTRASOUND OF THE TESTICLES TECHNIQUE: Complete ultrasound examination of the testicles, epididymis, and other scrotal structures was performed. Color and spectral Doppler ultrasound were also utilized to evaluate blood flow to the testicles. COMPARISON:  None. FINDINGS: Right testicle Measurements: 4.2 x 2.0 x 3.0 cm. No mass or microlithiasis visualized. Left testicle Measurements: 3.2 x 1.4 x 2.3 cm. No mass or microlithiasis visualized. Right epididymis:  Normal in size and appearance. Left epididymis: The left epididymis appears slightly hypoechoic. No increased vascularity. Hydrocele: There is a small right hydrocele with low-level echogenic debris. Varicocele:  Minimal left varicoceles may be present. Pulsed  Doppler interrogation of both testes demonstrates normal low resistance arterial and venous waveforms bilaterally. IMPRESSION: Unremarkable testicles. Small right hydrocele. Electronically Signed   By: Elgie Collard M.D.   On: 11/28/2015 21:26   [place] Art/ven Flow Abd Pelv Doppler  11/28/2015  CLINICAL DATA:  45 year old male with left testicular pain EXAM: SCROTAL ULTRASOUND DOPPLER ULTRASOUND OF THE TESTICLES TECHNIQUE: Complete ultrasound examination of the testicles, epididymis, and other scrotal structures was performed. Color and spectral Doppler ultrasound were also utilized to evaluate blood flow to the testicles. COMPARISON:  None. FINDINGS: Right testicle Measurements: 4.2 x 2.0 x 3.0 cm. No mass or microlithiasis visualized. Left testicle Measurements: 3.2 x 1.4 x 2.3 cm. No mass or microlithiasis visualized. Right epididymis:  Normal in size and appearance. Left epididymis: The left epididymis appears slightly hypoechoic. No increased vascularity. Hydrocele: There is a small right hydrocele with low-level echogenic debris. Varicocele:  Minimal left varicoceles may be present. Pulsed Doppler interrogation of both testes demonstrates normal low resistance arterial and venous waveforms bilaterally. IMPRESSION: Unremarkable testicles. Small right hydrocele. Electronically Signed   By: Elgie Collard M.D.   On: 11/28/2015 21:26   I have personally reviewed and evaluated these images and lab results as part of my medical decision-making.   EKG Interpretation None      MDM   Final diagnoses:  Testicular pain, left  Dysuria   Patient is  a 45 year old male with a history of migraines and depression presents the emergency department with left testicular pain since 8:30 AM today. All labs reviewed by me were unremarkable. Ultrasound of scrotum revealed unremarkable testicles and was negative for epididymitis, torsion. Urinalysis without blood concerning for kidney stones. WBC normal and urine  without nitrites, leukocytes so less likely this is an infectious cause.  Patient's pain was well controlled in the ED. He was given a prescription for pain medication upon discharge. Patient was instructed to acquire a primary care provider and follow up with them and/or urology. Urology contact information given in discharge instructions. Discussed strict return precautions with patient. He expressed understanding to the discharge instructions. Discussed case with Dr. Radford Pax and he agreed with the above plan.   Jerre Simon, PA 11/28/15 2322  Nelva Nay, MD 11/28/15 [phone]

## 2015-11-28 NOTE — ED Notes (Signed)
Patient transported to Ultrasound 

## 2015-11-28 NOTE — ED Notes (Addendum)
He c/o L testicular pain, dysuria, nausea, and lower back pain since 830am today. He has tried nothing for pain at home. He denies any injuries. Pain is increased with movemement. He is alert and breathing easily

## 2015-11-28 NOTE — Discharge Instructions (Signed)
Obtain a primary care provider and follow up with them regarding your visit to the emergency department today. I have also included a contact for Urology if your symptoms do not improve.   Return to the Emergency Department if your symptoms do not improve or you experience worsening testicular pain, blood in your urine or stools, fever, chills, abdominal pain, or vomiting.   Dysuria Dysuria is pain or discomfort while urinating. The pain or discomfort may be felt in the tube that carries urine out of the bladder (urethra) or in the surrounding tissue of the genitals. The pain may also be felt in the groin area, lower abdomen, and lower back. You may have to urinate frequently or have the sudden feeling that you have to urinate (urgency). Dysuria can affect both men and women, but is more common in women. Dysuria can be caused by many different things, including:  Urinary tract infection in women.  Infection of the kidney or bladder.  Kidney stones or bladder stones.  Certain sexually transmitted infections (STIs), such as chlamydia.  Dehydration.  Inflammation of the vagina.  Use of certain medicines.  Use of certain soaps or scented products that cause irritation. HOME CARE INSTRUCTIONS Watch your dysuria for any changes. The following actions may help to reduce any discomfort you are feeling:  Drink enough fluid to keep your urine clear or pale yellow.  Empty your bladder often. Avoid holding urine for long periods of time.  After a bowel movement or urination, women should cleanse from front to back, using each tissue only once.  Empty your bladder after sexual intercourse.  Take medicines only as directed by your health care provider.  If you were prescribed an antibiotic medicine, finish it all even if you start to feel better.  Avoid caffeine, tea, and alcohol. They can irritate the bladder and make dysuria worse. In men, alcohol may irritate the prostate.  Keep all  follow-up visits as directed by your health care provider. This is important.  If you had any tests done to find the cause of dysuria, it is your responsibility to obtain your test results. Ask the lab or department performing the test when and how you will get your results. Talk with your health care provider if you have any questions about your results. SEEK MEDICAL CARE IF:  You develop pain in your back or sides.  You have a fever.  You have nausea or vomiting.  You have blood in your urine.  You are not urinating as often as you usually do. SEEK IMMEDIATE MEDICAL CARE IF:  You pain is severe and not relieved with medicines.  You are unable to hold down any fluids.  You or someone else notices a change in your mental function.  You have a rapid heartbeat at rest.  You have shaking or chills.  You feel extremely weak.   This information is not intended to replace advice given to you by your health care provider. Make sure you discuss any questions you have with your health care provider.   Document Released: 03/29/2004 Document Revised: 07/22/2014 Document Reviewed: 02/24/2014 Elsevier Interactive Patient Education [employer]2016 Elsevier Inc.

## 2015-11-29 LAB — GC/CHLAMYDIA PROBE AMP (~~LOC~~) NOT AT ARMC
Chlamydia: NEGATIVE
NEISSERIA GONORRHEA: NEGATIVE

## 2015-11-30 LAB — URINE CULTURE: Culture: 30000 — AB

## 2015-12-16 ENCOUNTER — Emergency Department (HOSPITAL_COMMUNITY)
Admission: EM | Admit: 2015-12-16 | Discharge: 2015-12-16 | Disposition: A | Discharge status: Home / Self Care | Payer: Self-pay | Attending: Emergency Medicine | Admitting: Emergency Medicine

## 2015-12-16 ENCOUNTER — Emergency Department (HOSPITAL_COMMUNITY): Payer: Self-pay

## 2015-12-16 ENCOUNTER — Encounter (HOSPITAL_COMMUNITY): Payer: Self-pay | Admitting: *Deleted

## 2015-12-16 DIAGNOSIS — Z87891 Personal history of nicotine dependence: Secondary | ICD-10-CM | POA: Insufficient documentation

## 2015-12-16 DIAGNOSIS — N410 Acute prostatitis: Secondary | ICD-10-CM | POA: Insufficient documentation

## 2015-12-16 HISTORY — DX: Irritable bowel syndrome without diarrhea: K58.9

## 2015-12-16 LAB — COMPREHENSIVE METABOLIC PANEL
ALBUMIN: 4.2 g/dL (ref 3.5–5.0)
ALK PHOS: 68 U/L (ref 38–126)
ALT: 24 U/L (ref 17–63)
ANION GAP: 16 — AB (ref 5–15)
AST: 20 U/L (ref 15–41)
BUN: 18 mg/dL (ref 6–20)
CALCIUM: 10.1 mg/dL (ref 8.9–10.3)
CHLORIDE: 100 mmol/L — AB (ref 101–111)
CO2: 26 mmol/L (ref 22–32)
Creatinine, Ser: 0.89 mg/dL (ref 0.61–1.24)
GFR calc non Af Amer: >60 mL/min (ref >60)
GLUCOSE: 95 mg/dL (ref 65–99)
Potassium: 4 mmol/L (ref 3.5–5.1)
SODIUM: 142 mmol/L (ref 135–145)
Total Bilirubin: 0.7 mg/dL (ref 0.3–1.2)
Total Protein: 7.3 g/dL (ref 6.5–8.1)

## 2015-12-16 LAB — CBC WITH DIFFERENTIAL/PLATELET
BASOS PCT: 0 %
Basophils Absolute: 0 10*3/uL (ref 0.0–0.1)
EOS ABS: 0 10*3/uL (ref 0.0–0.7)
EOS PCT: 0 %
HCT: 44.8 % (ref 39.0–52.0)
HEMOGLOBIN: 15.5 g/dL (ref 13.0–17.0)
Lymphocytes Relative: 20 %
Lymphs Abs: 1.6 10*3/uL (ref 0.7–4.0)
MCH: 28.8 pg (ref 26.0–34.0)
MCHC: 34.6 g/dL (ref 30.0–36.0)
MCV: 83.1 fL (ref 78.0–100.0)
MONOS PCT: 9 %
Monocytes Absolute: 0.7 10*3/uL (ref 0.1–1.0)
NEUTROS PCT: 71 %
Neutro Abs: 5.7 10*3/uL (ref 1.7–7.7)
PLATELETS: 254 10*3/uL (ref 150–400)
RBC: 5.39 MIL/uL (ref 4.22–5.81)
RDW: 13.4 % (ref 11.5–15.5)
WBC: 8.1 10*3/uL (ref 4.0–10.5)

## 2015-12-16 LAB — URINALYSIS, ROUTINE W REFLEX MICROSCOPIC
Bilirubin Urine: NEGATIVE
Glucose, UA: NEGATIVE mg/dL
Hgb urine dipstick: NEGATIVE
Ketones, ur: NEGATIVE mg/dL
Leukocytes, UA: NEGATIVE
Nitrite: NEGATIVE
Protein, ur: NEGATIVE mg/dL
Specific Gravity, Urine: 1.022 (ref 1.005–1.030)
pH: 8 (ref 5.0–8.0)

## 2015-12-16 LAB — LIPASE, BLOOD: Lipase: 23 U/L (ref 11–51)

## 2015-12-16 MED ORDER — HYDROMORPHONE HCL 1 MG/ML IJ SOLN
0.5000 mg | Freq: Once | INTRAMUSCULAR | Status: AC
  Administered 2015-12-16: 0.5 mg via INTRAVENOUS
  Filled 2015-12-16: qty 1

## 2015-12-16 MED ORDER — CIPROFLOXACIN HCL 500 MG PO TABS: 500.0000 mg | ORAL_TABLET | Freq: Two times a day (BID) | ORAL | Status: DC

## 2015-12-16 MED ORDER — ONDANSETRON HCL 4 MG/2ML IJ SOLN
4.0000 mg | Freq: Once | INTRAMUSCULAR | Status: AC
  Administered 2015-12-16: 4 mg via INTRAVENOUS
  Filled 2015-12-16: qty 2

## 2015-12-16 MED ORDER — IOPAMIDOL (ISOVUE-300) INJECTION 61%
100.0000 mL | Freq: Once | INTRAVENOUS | Status: AC | PRN
  Administered 2015-12-16: 100 mL via INTRAVENOUS

## 2015-12-16 MED ORDER — SODIUM CHLORIDE 0.9 % IV BOLUS (SEPSIS)
1000.0000 mL | Freq: Once | INTRAVENOUS | Status: AC
  Administered 2015-12-16: 1000 mL via INTRAVENOUS

## 2015-12-16 MED ORDER — HYDROCODONE-ACETAMINOPHEN 5-325 MG PO TABS: 1.0000 | ORAL_TABLET | Freq: Four times a day (QID) | ORAL | Status: DC | PRN

## 2015-12-16 NOTE — Discharge Instructions (Signed)
Follow-up with [facility] in 1-2 weeks.

## 2015-12-16 NOTE — ED Notes (Signed)
Pt reports onset of weakness, lower abdominal pain, nausea, and chills when he woke up this morning.

## 2015-12-16 NOTE — ED Provider Notes (Signed)
CSN: [id number]     Arrival date & time 12/16/15  [id number] History   First MD Initiated Contact with Patient 12/16/15 [phone]     Chief Complaint  Patient presents with  . Abdominal Pain     (Consider location/radiation/quality/duration/timing/severity/associated sxs/prior Treatment) Patient is a 45 y.o. male presenting with abdominal pain. The history is provided by the patient (Patient complains of suprapubic abdominal pain with some fever).  Abdominal Pain Pain location:  Suprapubic Pain quality: aching   Pain radiates to:  Does not radiate Pain severity:  Moderate Onset quality:  Sudden Timing:  Constant Progression:  Worsening Chronicity:  New Context: alcohol use   Associated symptoms: no chest pain, no cough, no diarrhea, no fatigue and no hematuria     Past Medical History  Diagnosis Date  . Migraine   . Kidney stones   . Arthritis   . Irritable bowel syndrome (IBS) 2012   Past Surgical History  Procedure Laterality Date  . Appendectomy    . Cholecystectomy    . Left heart catheterization with coronary angiogram N/A 09/27/2013    Procedure: LEFT HEART CATHETERIZATION WITH CORONARY ANGIOGRAM;  Surgeon: Peter M SwazilandJordan, MD;  [facility];  Service: Cardiovascular;  Laterality: N/A;   Family History  Problem Relation Age of Onset  . COPD Mother    Social History  Substance Use Topics  . Smoking status: Former Games developermoker  . Smokeless tobacco: None  . Alcohol Use: No    Review of Systems  Constitutional: Negative for appetite change and fatigue.  HENT: Negative for congestion, ear discharge and sinus pressure.   Eyes: Negative for discharge.  Respiratory: Negative for cough.   Cardiovascular: Negative for chest pain.  Gastrointestinal: Positive for abdominal pain. Negative for diarrhea.  Genitourinary: Negative for frequency and hematuria.  Musculoskeletal: Negative for back pain.  Skin: Negative for rash.  Neurological: Negative for seizures and headaches.   Psychiatric/Behavioral: Negative for hallucinations.      Allergies  Review of patient's allergies indicates no known allergies.  Home Medications   Prior to Admission medications   Medication Sig Start Date End Date Taking? Authorizing Provider  busPIRone (BUSPAR) 5 MG tablet Take 7.5 mg by mouth 2 (two) times daily.   Yes Historical Provider, MD  ibuprofen (ADVIL,MOTRIN) 200 MG tablet Take 3 tablets (600 mg total) by mouth every 6 (six) hours as needed for headache or moderate pain. 11/28/15  Yes Jerre SimonJessica L Focht, PA  sertraline (ZOLOFT) 100 MG tablet Take 150 mg by mouth at bedtime.   Yes Historical Provider, MD  SUMAtriptan (IMITREX) 100 MG tablet Take 100 mg by mouth every 2 (two) hours as needed for migraine. May repeat in 2 hours if headache persists or recurs.   Yes Historical Provider, MD  traZODone (DESYREL) 100 MG tablet Take 100 mg by mouth at bedtime.   Yes Historical Provider, MD  traZODone (DESYREL) 50 MG tablet Take 50 mg by mouth at bedtime as needed for sleep.   Yes Historical Provider, MD  ciprofloxacin (CIPRO) 500 MG tablet Take 1 tablet (500 mg total) by mouth 2 (two) times daily. 12/16/15   Bethann BerkshireJoseph Azariya Freeman, MD  HYDROcodone-acetaminophen (NORCO/VICODIN) 5-325 MG tablet Take 1 tablet by mouth every 6 (six) hours as needed for moderate pain. 12/16/15   Bethann BerkshireJoseph Felishia Wartman, MD   BP 121/80 mmHg  Pulse 66  Temp(Src) 97.7 F (36.5 C) (Oral)  Resp 16  Ht 6' (1.829 m)  Wt 190 lb (86.183 kg)  BMI 25.76  kg/m2  SpO2 93% Physical Exam  Constitutional: He is oriented to person, place, and time. He appears well-developed.  HENT:  Head: Normocephalic.  Eyes: Conjunctivae and EOM are normal. No scleral icterus.  Neck: Neck supple. No thyromegaly present.  Cardiovascular: Normal rate and regular rhythm.  Exam reveals no gallop and no friction rub.   No murmur heard. Pulmonary/Chest: No stridor. He has no wheezes. He has no rales. He exhibits no tenderness.  Abdominal: He exhibits no  distension. There is tenderness. There is no rebound.  Tenderness to suprapubic  Genitourinary:  Rectal exam shows prostate to be tender on palpation  Musculoskeletal: Normal range of motion. He exhibits no edema.  Lymphadenopathy:    He has no cervical adenopathy.  Neurological: He is oriented to person, place, and time. He exhibits normal muscle tone. Coordination normal.  Skin: No rash noted. No erythema.  Psychiatric: He has a normal mood and affect. His behavior is normal.    ED Course  Procedures (including critical care time) Labs Review Labs Reviewed  COMPREHENSIVE METABOLIC PANEL - Abnormal; Notable for the following:    Chloride 100 (*)    Anion gap 16 (*)    All other components within normal limits  CBC WITH DIFFERENTIAL/PLATELET  URINALYSIS, ROUTINE W REFLEX MICROSCOPIC (NOT AT [facility])  LIPASE, BLOOD    Imaging Review Ct Abdomen Pelvis W Contrast  12/16/2015  CLINICAL DATA:  Pt reports onset of weakness, lower abdominal pain, nausea, and chills when he woke up this morning. EXAM: CT ABDOMEN AND PELVIS WITH CONTRAST TECHNIQUE: Multidetector CT imaging of the abdomen and pelvis was performed using the standard protocol following bolus administration of intravenous contrast. CONTRAST:  ISOVUE-300 IOPAMIDOL (ISOVUE-300) INJECTION 61% COMPARISON:  CT 11/14/2010 FINDINGS: Lower chest: Minimal dependent atelectasis. Heart is normal size. No effusions. Hepatobiliary: Area of focal fatty infiltration near the falciform ligament. No other focal hepatic abnormality. Gallbladder unremarkable. Pancreas: No focal abnormality or ductal dilatation. Spleen: No focal abnormality.  Normal size. Adrenals/Urinary Tract: No adrenal abnormality. No focal renal abnormality. No stones or hydronephrosis. Urinary bladder is unremarkable. Stomach/Bowel: Prior appendectomy. Stomach, large and small bowel grossly unremarkable. Vascular/Lymphatic: No evidence of aneurysm or adenopathy. Reproductive: No  visible focal abnormality. Other: No free fluid or free air. Small umbilical hernia containing fat. Musculoskeletal: No acute bony abnormality or focal bone lesion. IMPRESSION: No acute findings in the abdomen or pelvis. Electronically Signed   By: Charlett Nose M.D.   On: 12/16/2015 10:18   I have personally reviewed and evaluated these images and lab results as part of my medical decision-making.   EKG Interpretation None      MDM   Final diagnoses:  Prostatitis, acute    Patient with lower abdomen tenderness and tender prostate. Labs and CT scan of abdomen unremarkable. Patient will be treated for prostatitis with Cipro and Ultram and will follow-up with urologist    Bethann Berkshire, MD 12/16/15 1118

## 2015-12-16 NOTE — ED Notes (Signed)
RN Leotis ShamesLauren to start IV and get labs

## 2016-02-09 ENCOUNTER — Emergency Department (HOSPITAL_COMMUNITY)
Admission: EM | Admit: 2016-02-09 | Discharge: 2016-02-10 | Disposition: A | Discharge status: Home / Self Care | Payer: Self-pay | Attending: Emergency Medicine | Admitting: Emergency Medicine

## 2016-02-09 ENCOUNTER — Encounter (HOSPITAL_COMMUNITY): Payer: Self-pay | Admitting: Emergency Medicine

## 2016-02-09 ENCOUNTER — Emergency Department (HOSPITAL_COMMUNITY): Payer: Self-pay

## 2016-02-09 DIAGNOSIS — R1032 Left lower quadrant pain: Secondary | ICD-10-CM | POA: Insufficient documentation

## 2016-02-09 DIAGNOSIS — R51 Headache: Secondary | ICD-10-CM | POA: Insufficient documentation

## 2016-02-09 DIAGNOSIS — R0789 Other chest pain: Secondary | ICD-10-CM | POA: Insufficient documentation

## 2016-02-09 DIAGNOSIS — R519 Headache, unspecified: Secondary | ICD-10-CM

## 2016-02-09 DIAGNOSIS — Z87891 Personal history of nicotine dependence: Secondary | ICD-10-CM | POA: Insufficient documentation

## 2016-02-09 HISTORY — DX: Post-traumatic stress disorder, unspecified: F43.10

## 2016-02-09 LAB — CBC
HCT: 39.8 % (ref 39.0–52.0)
HEMOGLOBIN: 13.5 g/dL (ref 13.0–17.0)
MCH: 28.1 pg (ref 26.0–34.0)
MCHC: 33.9 g/dL (ref 30.0–36.0)
MCV: 82.9 fL (ref 78.0–100.0)
PLATELETS: 195 10*3/uL (ref 150–400)
RBC: 4.8 MIL/uL (ref 4.22–5.81)
RDW: 13.2 % (ref 11.5–15.5)
WBC: 7.6 10*3/uL (ref 4.0–10.5)

## 2016-02-09 LAB — URINALYSIS, ROUTINE W REFLEX MICROSCOPIC
BILIRUBIN URINE: NEGATIVE
Glucose, UA: NEGATIVE mg/dL
Hgb urine dipstick: NEGATIVE
KETONES UR: NEGATIVE mg/dL
LEUKOCYTES UA: NEGATIVE
NITRITE: NEGATIVE
PH: 8 (ref 5.0–8.0)
PROTEIN: NEGATIVE mg/dL
Specific Gravity, Urine: 1.023 (ref 1.005–1.030)

## 2016-02-09 LAB — BASIC METABOLIC PANEL
ANION GAP: 7 (ref 5–15)
BUN: 15 mg/dL (ref 6–20)
CALCIUM: 8.8 mg/dL — AB (ref 8.9–10.3)
CO2: 23 mmol/L (ref 22–32)
CREATININE: 0.99 mg/dL (ref 0.61–1.24)
Chloride: 103 mmol/L (ref 101–111)
GLUCOSE: 105 mg/dL — AB (ref 65–99)
Potassium: 3.9 mmol/L (ref 3.5–5.1)
Sodium: 133 mmol/L — ABNORMAL LOW (ref 135–145)

## 2016-02-09 LAB — I-STAT TROPONIN, ED: TROPONIN I, POC: 0 ng/mL (ref 0.00–0.08)

## 2016-02-09 MED ORDER — SODIUM CHLORIDE 0.9 % IV SOLN
1000.0000 mL | Freq: Once | INTRAVENOUS | Status: AC
  Administered 2016-02-09: 1000 mL via INTRAVENOUS

## 2016-02-09 MED ORDER — DIPHENHYDRAMINE HCL 50 MG/ML IJ SOLN
25.0000 mg | Freq: Once | INTRAMUSCULAR | Status: AC
  Administered 2016-02-09: 25 mg via INTRAVENOUS
  Filled 2016-02-09: qty 1

## 2016-02-09 MED ORDER — SODIUM CHLORIDE 0.9 % IV SOLN
1000.0000 mL | INTRAVENOUS | Status: DC
  Administered 2016-02-10: 1000 mL via INTRAVENOUS

## 2016-02-09 MED ORDER — METOCLOPRAMIDE HCL 5 MG/ML IJ SOLN
10.0000 mg | Freq: Once | INTRAMUSCULAR | Status: AC
Start: 2016-02-09 — End: 2016-02-09
  Administered 2016-02-09: 10 mg via INTRAVENOUS
  Filled 2016-02-09: qty 2

## 2016-02-09 MED ORDER — KETOROLAC TROMETHAMINE 30 MG/ML IJ SOLN
30.0000 mg | Freq: Once | INTRAMUSCULAR | Status: AC
  Administered 2016-02-09: 30 mg via INTRAVENOUS
  Filled 2016-02-09: qty 1

## 2016-02-09 NOTE — ED Triage Notes (Addendum)
Pt reports that he began to experience left sided flank pain today around 15:30.  He also c/o an "on and off headache" for three weeks and pain in his testicles.  Hx of pancreatitis  and migraines.  Upon speaking to the pt he states that the pain is moving into his chest.

## 2016-02-09 NOTE — ED Provider Notes (Signed)
[facility]-EMERGENCY DEPT Provider Note   CSN: [id number] Arrival date & time: 02/09/16  2221  First Provider Contact:  First MD Initiated Contact with Patient 02/09/16 2302     By signing my name below, I, Vista Mink, attest that this documentation has been prepared under the direction and in the presence of Dione Booze, MD. Electronically signed, Vista Mink, ED Scribe. 02/09/16. 11:14 PM.    History   Chief Complaint Chief Complaint  Patient presents with  . Flank Pain  . Migraine  . Chest Pain    HPI Frank Soto is a 45 y.o. male.  HPI Comments: Frank Soto is a 46 y.o. Male with a Hx of prostatitis, who presents to the Emergency Department with sudden onset left flank pain that started around 15:30. Pt also complains of intermittent headache to the front of his head that has been persistent for the past two weeks. Pt states that the headache started two weeks ago in the left side of his head but has moved towards the temporal region of his head today. Pt states his headache has been exacerbated by being outside in the head for an extended period of time. Pt has a Hx of migraines but states that this headache feels different. He reports that his normal migraines have associated photophobia but denies any current photophobia within the past two weeks of headache. Pt has taken ibuprofen and aleve with mild relief of the current headache. Pt also reports pain in his suprapubic region.   Pt further complains of numbness from his left anterior chest that radiates down to his left quadricept; states that it does not feel related to his headache. Pt also reports associated dizziness and blurry vision that started today. Pt also states he has felt intermittently nauseas and short of breath today but denies vomiting. Pt denies current PCP. Pt denies any recent cough     The history is provided by the patient. No language interpreter was used.  Migraine  Associated symptoms include  headaches.  Chest Pain   Associated symptoms include headaches and numbness.    Past Medical History:  Diagnosis Date  . Arthritis   . Irritable bowel syndrome (IBS) 2012  . Kidney stones   . Migraine   . PTSD (post-traumatic stress disorder)     Patient Active Problem List   Diagnosis Date Noted  . Left leg injury 12/27/2013  . Dyslipidemia 10/21/2013  . HLD (hyperlipidemia) 09/26/2013  . Shortness of breath 09/23/2013  . Chest pain 09/23/2013  . Atelectasis 09/23/2013  . Pre-syncope 09/23/2013  . Migraine 12/15/2012    Past Surgical History:  Procedure Laterality Date  . APPENDECTOMY    . CHOLECYSTECTOMY    . LEFT HEART CATHETERIZATION WITH CORONARY ANGIOGRAM N/A 09/27/2013   Procedure: LEFT HEART CATHETERIZATION WITH CORONARY ANGIOGRAM;  Surgeon: Peter M Swaziland, MD;  Location: [facility] CATH LAB;  Service: Cardiovascular;  Laterality: N/A;       Home Medications    Prior to Admission medications   Medication Sig Start Date End Date Taking? Authorizing Provider  busPIRone (BUSPAR) 5 MG tablet Take 7.5 mg by mouth 2 (two) times daily.    Historical Provider, MD  ciprofloxacin (CIPRO) 500 MG tablet Take 1 tablet (500 mg total) by mouth 2 (two) times daily. 12/16/15   Bethann Berkshire, MD  HYDROcodone-acetaminophen (NORCO/VICODIN) 5-325 MG tablet Take 1 tablet by mouth every 6 (six) hours as needed for moderate pain. 12/16/15   Bethann Berkshire, MD  ibuprofen (ADVIL,MOTRIN)  200 MG tablet Take 3 tablets (600 mg total) by mouth every 6 (six) hours as needed for headache or moderate pain. 11/28/15   Jerre Simon, PA  sertraline (ZOLOFT) 100 MG tablet Take 150 mg by mouth at bedtime.    Historical Provider, MD  SUMAtriptan (IMITREX) 100 MG tablet Take 100 mg by mouth every 2 (two) hours as needed for migraine. May repeat in 2 hours if headache persists or recurs.    Historical Provider, MD  traZODone (DESYREL) 100 MG tablet Take 100 mg by mouth at bedtime.    Historical Provider, MD    traZODone (DESYREL) 50 MG tablet Take 50 mg by mouth at bedtime as needed for sleep.    Historical Provider, MD    Family History Family History  Problem Relation Age of Onset  . COPD Mother     Social History Social History  Substance Use Topics  . Smoking status: Former Games developer  . Smokeless tobacco: Not on file  . Alcohol use No     Allergies   Review of patient's allergies indicates no known allergies.   Review of Systems Review of Systems  Neurological: Positive for numbness and headaches.  All other systems reviewed and are negative.    Physical Exam Updated Vital Signs BP 131/70 (BP Location: Right Arm)   Pulse 82   Temp 99.8 F (37.7 C) (Oral)   Resp 16   Ht 6' (1.829 m)   Wt 187 lb (84.8 kg)   SpO2 96% Comment: Simultaneous filing. User may not have seen previous data.  BMI 25.36 kg/m   Physical Exam  Constitutional: He is oriented to person, place, and time. He appears well-developed and well-nourished. No distress.  HENT:  Head: Normocephalic and atraumatic.  Tender over left temporalis muscle and tender at insertion of left paracervical muscles.   Eyes: EOM are normal. Pupils are equal, round, and reactive to light.  Neck: Normal range of motion. Neck supple. No JVD present.  Cardiovascular: Normal rate, regular rhythm and normal heart sounds.   No murmur heard. Pulmonary/Chest: Effort normal. He has no wheezes. He has no rales. He exhibits tenderness.  Chest tender diffusely over left anterior and lateral chest wall.  Abdominal: Soft. Bowel sounds are normal. He exhibits no mass. There is tenderness. There is no rebound and no guarding.  Tender left side of abdomen and suprapubic area no rebound or guarding.  Musculoskeletal: Normal range of motion.  Lymphadenopathy:    He has no cervical adenopathy.  Neurological: He is alert and oriented to person, place, and time. No cranial nerve deficit. He exhibits normal muscle tone. Coordination normal.   Skin: Skin is warm and dry. No rash noted. He is not diaphoretic.  Psychiatric: He has a normal mood and affect. His behavior is normal. Judgment and thought content normal.  Nursing note and vitals reviewed.    ED Treatments / Results  Labs (all labs ordered are listed, but only abnormal results are displayed) Labs Reviewed  BASIC METABOLIC PANEL - Abnormal; Notable for the following:       Result Value   Sodium 133 (*)    Glucose, Bld 105 (*)    Calcium 8.8 (*)    All other components within normal limits  CBC  DIFFERENTIAL  URINALYSIS, ROUTINE W REFLEX MICROSCOPIC (NOT AT [facility])  Rosezena Sensor, ED    EKG  EKG Interpretation  Date/Time:  Friday February 09 2016 22:32:46 EDT Ventricular Rate:  84 PR Interval:  QRS Duration: 97 QT Interval:  353 QTC Calculation: 418 R Axis:   -45 Text Interpretation:  Sinus rhythm LAD, consider left anterior fascicular block RSR' in V1 or V2, probably normal variant When compared with ECG of 04/28/2015, No significant change was found Confirmed by [facility]  MD, Leeana Creer ([id number]) on 02/09/2016 10:46:34 PM       Radiology Dg Chest 2 View  Result Date: 02/09/2016 CLINICAL DATA:  Initial evaluation for acute left-sided chest pain with left arm numbness. EXAM: CHEST  2 VIEW COMPARISON:  Prior radiograph from 05/01/2015. FINDINGS: Cardiac and mediastinal silhouettes are stable in size and contour, and remain within normal limits. Lungs are hypoinflated with secondary bibasilar bronchovascular crowding, left greater than right. No definite focal airspace disease. No pulmonary edema or pleural effusion. No pneumothorax. Osseous structures unchanged. IMPRESSION: Shallow lung inflation with associated bibasilar bronchovascular crowding/ atelectasis, left greater than right. No other active cardiopulmonary disease identified. Electronically Signed   By: Rise Mu M.D.   On: 02/09/2016 23:06   Procedures Procedures  DIAGNOSTIC STUDIES: Oxygen  Saturation is 96% on RA, normal by my interpretation.  COORDINATION OF CARE: 11:22 PM-Will order imaging and medication. Discussed treatment plan with pt at bedside and pt agreed to plan.    Medications Ordered in ED Medications  ketorolac (TORADOL) 30 MG/ML injection 30 mg (30 mg Intravenous Given 02/09/16 2334)  0.9 %  sodium chloride infusion (0 mLs Intravenous Stopped 02/10/16 0154)  metoCLOPramide (REGLAN) injection 10 mg (10 mg Intravenous Given 02/09/16 2341)  diphenhydrAMINE (BENADRYL) injection 25 mg (25 mg Intravenous Given 02/09/16 2336)  morphine 4 MG/ML injection 4 mg (4 mg Intravenous Given 02/10/16 0138)  dexamethasone (DECADRON) injection 10 mg (10 mg Intravenous Given 02/10/16 0328)     Initial Impression / Assessment and Plan / ED Course  I have reviewed the triage vital signs and the nursing notes.  Pertinent labs & imaging results that were available during my care of the patient were reviewed by me and considered in my medical decision making (see chart for details).  Clinical Course  Value Comment By Time  Troponin i, poc: 0.00 (Reviewed) Dione Booze, MD 07/28 2303  WBC: 7.6 Normal CBC, Troponin Dione Booze, MD 07/28 2303    Headache with physical findings strongly suggestive of muscle contraction headache. Chest and abdominal pain of uncertain cause. No advice to suggest serious causes of pain. Because headache is a different pattern than his normal migraine headaches, he will be sent for CT of head. He will be given a migraine cocktail of normal saline, metoclopramide, diphenhydramine, and ketorolac and will be reassessed. Old records are reviewed and he had a cardiac catheterization in March 2015 showing normal coronary arteries.  He did not get relief of headache with above noted treatment, but chest and abdomen pain improved. CT shows no evidence of any acute process. He is given a dose of morphine with excellent relief of his headache. He is discharged with  prescription for oxycodone have acetaminophen. Is given a dose of dexamethasone prior to discharge. Advised use over-the-counter NSAIDs as needed.  Final Clinical Impressions(s) / ED Diagnoses   Final diagnoses:  Headache, unspecified headache type  Chest wall pain    New Prescriptions Discharge Medication List as of 02/10/2016  3:06 AM    START taking these medications   Details  oxyCODONE-acetaminophen (PERCOCET) 5-325 MG tablet Take 1 tablet by mouth every 4 (four) hours as needed for moderate pain., Starting Sat 02/10/2016, Print  I personally performed the services described in this documentation, which was scribed in my presence. The recorded information has been reviewed and is accurate.       Dione Booze, MD 02/10/16 2029

## 2016-02-10 ENCOUNTER — Emergency Department (HOSPITAL_COMMUNITY): Payer: Self-pay

## 2016-02-10 LAB — DIFFERENTIAL
BASOS ABS: 0 10*3/uL (ref 0.0–0.1)
Band Neutrophils: 0 %
Basophils Relative: 0 %
Blasts: 0 %
EOS ABS: 0.1 10*3/uL (ref 0.0–0.7)
EOS PCT: 1 %
LYMPHS ABS: 2.7 10*3/uL (ref 0.7–4.0)
Lymphocytes Relative: 36 %
METAMYELOCYTES PCT: 0 %
MONO ABS: 0.5 10*3/uL (ref 0.1–1.0)
MYELOCYTES: 0 %
Monocytes Relative: 6 %
Neutro Abs: 4.3 10*3/uL (ref 1.7–7.7)
Neutrophils Relative %: 57 %
Other: 0 %
PROMYELOCYTES ABS: 0 %
nRBC: 0 /100 WBC

## 2016-02-10 MED ORDER — MORPHINE SULFATE (PF) 4 MG/ML IV SOLN
4.0000 mg | Freq: Once | INTRAVENOUS | Status: AC
  Administered 2016-02-10: 4 mg via INTRAVENOUS
  Filled 2016-02-10: qty 1

## 2016-02-10 MED ORDER — OXYCODONE-ACETAMINOPHEN 5-325 MG PO TABS: 1.0000 | ORAL_TABLET | ORAL | 0 refills | Status: DC | PRN

## 2016-02-10 MED ORDER — DEXAMETHASONE SODIUM PHOSPHATE 10 MG/ML IJ SOLN
10.0000 mg | Freq: Once | INTRAMUSCULAR | Status: AC
  Administered 2016-02-10: 10 mg via INTRAVENOUS
  Filled 2016-02-10: qty 1

## 2016-02-10 NOTE — ED Notes (Signed)
Pt departed in NAD.  

## 2016-02-10 NOTE — Discharge Instructions (Signed)
Take ibuprofen or naproxen as needed for pain. °

## 2016-02-10 NOTE — ED Notes (Signed)
Report given to Mario RN.

## 2016-08-09 ENCOUNTER — Emergency Department (HOSPITAL_COMMUNITY)
Admission: EM | Admit: 2016-08-09 | Discharge: 2016-08-09 | Disposition: A | Discharge status: Home / Self Care | Payer: Self-pay | Attending: Emergency Medicine | Admitting: Emergency Medicine

## 2016-08-09 ENCOUNTER — Encounter (HOSPITAL_COMMUNITY): Payer: Self-pay

## 2016-08-09 DIAGNOSIS — Z87891 Personal history of nicotine dependence: Secondary | ICD-10-CM | POA: Insufficient documentation

## 2016-08-09 DIAGNOSIS — R45851 Suicidal ideations: Secondary | ICD-10-CM

## 2016-08-09 DIAGNOSIS — Z79899 Other long term (current) drug therapy: Secondary | ICD-10-CM | POA: Insufficient documentation

## 2016-08-09 DIAGNOSIS — F329 Major depressive disorder, single episode, unspecified: Secondary | ICD-10-CM | POA: Insufficient documentation

## 2016-08-09 LAB — COMPREHENSIVE METABOLIC PANEL
ALT: 25 U/L (ref 17–63)
ANION GAP: 7 (ref 5–15)
AST: 24 U/L (ref 15–41)
Albumin: 4.1 g/dL (ref 3.5–5.0)
Alkaline Phosphatase: 79 U/L (ref 38–126)
BUN: 15 mg/dL (ref 6–20)
CHLORIDE: 106 mmol/L (ref 101–111)
CO2: 26 mmol/L (ref 22–32)
CREATININE: 1.08 mg/dL (ref 0.61–1.24)
Calcium: 9.3 mg/dL (ref 8.9–10.3)
GFR calc non Af Amer: >60 mL/min (ref >60)
Glucose, Bld: 115 mg/dL — ABNORMAL HIGH (ref 65–99)
Potassium: 4 mmol/L (ref 3.5–5.1)
SODIUM: 139 mmol/L (ref 135–145)
Total Bilirubin: 0.5 mg/dL (ref 0.3–1.2)
Total Protein: 6.8 g/dL (ref 6.5–8.1)

## 2016-08-09 LAB — CBC
HCT: 44 % (ref 39.0–52.0)
HEMOGLOBIN: 15.2 g/dL (ref 13.0–17.0)
MCH: 29.5 pg (ref 26.0–34.0)
MCHC: 34.5 g/dL (ref 30.0–36.0)
MCV: 85.4 fL (ref 78.0–100.0)
PLATELETS: 290 10*3/uL (ref 150–400)
RBC: 5.15 MIL/uL (ref 4.22–5.81)
RDW: 13.4 % (ref 11.5–15.5)
WBC: 10.5 10*3/uL (ref 4.0–10.5)

## 2016-08-09 LAB — RAPID URINE DRUG SCREEN, HOSP PERFORMED
AMPHETAMINES: NOT DETECTED
BENZODIAZEPINES: NOT DETECTED
Barbiturates: NOT DETECTED
Cocaine: NOT DETECTED
OPIATES: NOT DETECTED
TETRAHYDROCANNABINOL: NOT DETECTED

## 2016-08-09 LAB — ACETAMINOPHEN LEVEL: Acetaminophen (Tylenol), Serum: <10 ug/mL — ABNORMAL LOW (ref 10–30)

## 2016-08-09 LAB — ETHANOL: Alcohol, Ethyl (B): <5 mg/dL (ref <5)

## 2016-08-09 LAB — SALICYLATE LEVEL

## 2016-08-09 MED ORDER — SERTRALINE HCL 50 MG PO TABS: 100.0000 mg | ORAL_TABLET | Freq: Every day | ORAL | Status: DC

## 2016-08-09 MED ORDER — BUPROPION HCL ER (XL) 150 MG PO TB24: 150.0000 mg | ORAL_TABLET | Freq: Every day | ORAL | Status: DC

## 2016-08-09 MED ORDER — BUSPIRONE HCL 15 MG PO TABS
7.5000 mg | ORAL_TABLET | Freq: Two times a day (BID) | ORAL | Status: DC
  Administered 2016-08-09: 7.5 mg via ORAL
  Filled 2016-08-09: qty 1

## 2016-08-09 MED ORDER — TRAZODONE HCL 100 MG PO TABS: 100.0000 mg | ORAL_TABLET | Freq: Every day | ORAL | Status: DC

## 2016-08-09 NOTE — ED Notes (Signed)
Pt laying in floor in triage stating that he just wants to make his mind stop, unable to make pt get out of the floor.

## 2016-08-09 NOTE — ED Provider Notes (Signed)
[facility] Provider Note   CSN: [id number] Arrival date & time: 08/09/16  0025     History   Chief Complaint Chief Complaint  Patient presents with  . Suicidal    HPI Frank Soto is a 46 y.o. male.  The history is provided by the patient.   Frank Soto is a 46 y.o. male  with a PMH of PTSD, migraines, IBS, HLD who presents to the Emergency Department voluntarily complaining of suicidal ideations without exact plan which began last night. Patient states that he has had a great deal of emotional stressors recently leading to suicidal thoughts. He endorses passive SI intermittently ever since he was a young child, but denies prior attempt. No HI. He notes voices telling him to do certain things and telling him that his poor life decisions are okay and to continue doing them. Denies visual hallucinations. No ETOH or drug use.    Past Medical History:  Diagnosis Date  . Arthritis   . Irritable bowel syndrome (IBS) 2012  . Kidney stones   . Migraine   . PTSD (post-traumatic stress disorder)     Patient Active Problem List   Diagnosis Date Noted  . Left leg injury 12/27/2013  . Dyslipidemia 10/21/2013  . HLD (hyperlipidemia) 09/26/2013  . Shortness of breath 09/23/2013  . Chest pain 09/23/2013  . Atelectasis 09/23/2013  . Pre-syncope 09/23/2013  . Migraine 12/15/2012    Past Surgical History:  Procedure Laterality Date  . APPENDECTOMY    . CHOLECYSTECTOMY    . LEFT HEART CATHETERIZATION WITH CORONARY ANGIOGRAM N/A 09/27/2013   Procedure: LEFT HEART CATHETERIZATION WITH CORONARY ANGIOGRAM;  Surgeon: Peter M SwazilandJordan, MD;  [facility];  Service: Cardiovascular;  Laterality: N/A;       Home Medications    Prior to Admission medications   Medication Sig Start Date End Date Taking? Authorizing Provider  buPROPion (WELLBUTRIN XL) 150 MG 24 hr tablet Take 150 mg by mouth daily.    Historical Provider, MD  busPIRone (BUSPAR) 5 MG tablet Take 7.5 mg  by mouth 2 (two) times daily.    Historical Provider, MD  oxyCODONE-acetaminophen (PERCOCET) 5-325 MG tablet Take 1 tablet by mouth every 4 (four) hours as needed for moderate pain. 02/10/16   Dione Boozeavid Glick, MD  sertraline (ZOLOFT) 100 MG tablet Take 100 mg by mouth at bedtime.     Historical Provider, MD  SUMAtriptan (IMITREX) 100 MG tablet Take 100 mg by mouth every 2 (two) hours as needed for migraine. May repeat in 2 hours if headache persists or recurs.    Historical Provider, MD  traZODone (DESYREL) 100 MG tablet Take 100 mg by mouth at bedtime.    Historical Provider, MD    Family History Family History  Problem Relation Age of Onset  . COPD Mother     Social History Social History  Substance Use Topics  . Smoking status: Former Games developermoker  . Smokeless tobacco: Never Used  . Alcohol use No     Allergies   Patient has no known allergies.   Review of Systems Review of Systems  Constitutional: Negative for fever.  HENT: Negative for congestion.   Eyes: Negative for visual disturbance.  Respiratory: Negative for cough.   Cardiovascular: Negative for chest pain.  Gastrointestinal: Negative for abdominal pain.  Musculoskeletal: Negative for back pain.  Skin: Negative for rash.  Neurological: Negative for headaches.  Psychiatric/Behavioral: Positive for suicidal ideas. Negative for self-injury.     Physical Exam  Updated Vital Signs BP 114/77 (BP Location: Right Arm)   Pulse 63   Temp 97.6 F (36.4 C)   Resp 18   SpO2 99%   Physical Exam  Constitutional: He is oriented to person, place, and time. He appears well-developed and well-nourished. No distress.  HENT:  Head: Normocephalic and atraumatic.  Cardiovascular: Normal rate, regular rhythm and normal heart sounds.   No murmur heard. Pulmonary/Chest: Effort normal and breath sounds normal. No respiratory distress.  Musculoskeletal: Normal range of motion.  Neurological: He is alert and oriented to person, place, and  time.  Skin: Skin is warm and dry.  Psychiatric: He exhibits a depressed mood.  Nursing note and vitals reviewed.    ED Treatments / Results  Labs (all labs ordered are listed, but only abnormal results are displayed) Labs Reviewed  COMPREHENSIVE METABOLIC PANEL - Abnormal; Notable for the following:       Result Value   Glucose, Bld 115 (*)    All other components within normal limits  ACETAMINOPHEN LEVEL - Abnormal; Notable for the following:    Acetaminophen (Tylenol), Serum <10 (*)    All other components within normal limits  ETHANOL  SALICYLATE LEVEL  CBC  RAPID URINE DRUG SCREEN, HOSP PERFORMED    EKG  EKG Interpretation None       Radiology No results found.  Procedures Procedures (including critical care time)  Medications Ordered in ED Medications  busPIRone (BUSPAR) tablet 7.5 mg (not administered)  buPROPion (WELLBUTRIN XL) 24 hr tablet 150 mg (not administered)  sertraline (ZOLOFT) tablet 100 mg (not administered)  traZODone (DESYREL) tablet 100 mg (not administered)     Initial Impression / Assessment and Plan / ED Course  I have reviewed the triage vital signs and the nursing notes.  Pertinent labs & imaging results that were available during my care of the patient were reviewed by me and considered in my medical decision making (see chart for details).    Frank Soto is a 46 y.o. male who presents to ED voluntarily for suicidal thoughts without plan. Labs reviewed and reassuring. Medically cleared and disposition per TTS recommendations.   Final Clinical Impressions(s) / ED Diagnoses   Final diagnoses:  None    New Prescriptions New Prescriptions   No medications on file     [facility] Lakena Sparlin, PA-C 08/09/16 1610    Dione Booze, MD 08/09/16 [phone]
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
## 2016-08-09 NOTE — Progress Notes (Signed)
CSW received Patient's mobile crisis assessment and crisis plan. Per mobile crisis, Patient to receive copy of crisis plan and ROI. Patient's copy provided to RN. CSW [facility] mobile crisis assessment and [facility], and original placed in Patient's shadow chart to be placed in Medical Records.    Enos FlingAshley [employer], MSW, LCSW [facility] ED/[facility]M Clinical Social Worker [phone]

## 2016-08-09 NOTE — ED Triage Notes (Signed)
Pt states that his is having SI, denies HI, c/o of auditory hallucinations telling him to harm himself. Pt states he has been having these thoughts for a while now, pt states that he recently lost his whole family and place to live.

## 2016-08-09 NOTE — Discharge Instructions (Signed)
Follow up with behavioral health as an outpatient as directed. Return to ER for new or worsening symptoms, any additional concerns.

## 2016-08-09 NOTE — ED Notes (Signed)
Pt. On telephone with family member at this time.

## 2017-05-07 ENCOUNTER — Emergency Department (HOSPITAL_COMMUNITY)
Admission: EM | Admit: 2017-05-07 | Discharge: 2017-05-07 | Disposition: A | Discharge status: Home / Self Care | Payer: BLUE CROSS/BLUE SHIELD | Attending: Emergency Medicine | Admitting: Emergency Medicine

## 2017-05-07 ENCOUNTER — Encounter (HOSPITAL_COMMUNITY): Payer: Self-pay | Admitting: *Deleted

## 2017-05-07 DIAGNOSIS — Z87891 Personal history of nicotine dependence: Secondary | ICD-10-CM | POA: Insufficient documentation

## 2017-05-07 DIAGNOSIS — M545 Low back pain: Secondary | ICD-10-CM | POA: Insufficient documentation

## 2017-05-07 DIAGNOSIS — N410 Acute prostatitis: Secondary | ICD-10-CM | POA: Diagnosis not present

## 2017-05-07 DIAGNOSIS — R1032 Left lower quadrant pain: Secondary | ICD-10-CM | POA: Diagnosis present

## 2017-05-07 LAB — COMPREHENSIVE METABOLIC PANEL
ALBUMIN: 4.1 g/dL (ref 3.5–5.0)
ALT: 31 U/L (ref 17–63)
AST: 21 U/L (ref 15–41)
Alkaline Phosphatase: 67 U/L (ref 38–126)
Anion gap: 8 (ref 5–15)
BILIRUBIN TOTAL: 0.8 mg/dL (ref 0.3–1.2)
BUN: 23 mg/dL — AB (ref 6–20)
CHLORIDE: 105 mmol/L (ref 101–111)
CO2: 25 mmol/L (ref 22–32)
Calcium: 9.2 mg/dL (ref 8.9–10.3)
Creatinine, Ser: 1.13 mg/dL (ref 0.61–1.24)
GFR calc Af Amer: >60 mL/min (ref >60)
GFR calc non Af Amer: >60 mL/min (ref >60)
GLUCOSE: 72 mg/dL (ref 65–99)
POTASSIUM: 4.9 mmol/L (ref 3.5–5.1)
Sodium: 138 mmol/L (ref 135–145)
Total Protein: 6.6 g/dL (ref 6.5–8.1)

## 2017-05-07 LAB — CBC
HEMATOCRIT: 46.4 % (ref 39.0–52.0)
Hemoglobin: 16.1 g/dL (ref 13.0–17.0)
MCH: 29.2 pg (ref 26.0–34.0)
MCHC: 34.7 g/dL (ref 30.0–36.0)
MCV: 84.2 fL (ref 78.0–100.0)
Platelets: 269 10*3/uL (ref 150–400)
RBC: 5.51 MIL/uL (ref 4.22–5.81)
RDW: 13.1 % (ref 11.5–15.5)
WBC: 9.1 10*3/uL (ref 4.0–10.5)

## 2017-05-07 LAB — URINALYSIS, ROUTINE W REFLEX MICROSCOPIC
BILIRUBIN URINE: NEGATIVE
GLUCOSE, UA: NEGATIVE mg/dL
HGB URINE DIPSTICK: NEGATIVE
KETONES UR: NEGATIVE mg/dL
Leukocytes, UA: NEGATIVE
Nitrite: NEGATIVE
PH: 5 (ref 5.0–8.0)
Protein, ur: NEGATIVE mg/dL
Specific Gravity, Urine: 1.023 (ref 1.005–1.030)

## 2017-05-07 LAB — LIPASE, BLOOD: Lipase: 39 U/L (ref 11–51)

## 2017-05-07 MED ORDER — ONDANSETRON 4 MG PO TBDP
4.0000 mg | ORAL_TABLET | Freq: Once | ORAL | Status: AC
  Administered 2017-05-07: 4 mg via ORAL
  Filled 2017-05-07: qty 1

## 2017-05-07 MED ORDER — HYDROMORPHONE HCL 1 MG/ML IJ SOLN
1.0000 mg | Freq: Once | INTRAMUSCULAR | Status: AC
  Administered 2017-05-07: 1 mg via INTRAMUSCULAR
  Filled 2017-05-07: qty 1

## 2017-05-07 MED ORDER — LEVOFLOXACIN 500 MG PO TABS: 500.0000 mg | ORAL_TABLET | Freq: Every day | ORAL | 0 refills | Status: AC

## 2017-05-07 MED ORDER — OXYCODONE-ACETAMINOPHEN 5-325 MG PO TABS: 1.0000 | ORAL_TABLET | Freq: Four times a day (QID) | ORAL | 0 refills | Status: DC | PRN

## 2017-05-07 MED ORDER — KETOROLAC TROMETHAMINE 60 MG/2ML IM SOLN
30.0000 mg | Freq: Once | INTRAMUSCULAR | Status: AC
  Administered 2017-05-07: 30 mg via INTRAMUSCULAR
  Filled 2017-05-07: qty 2

## 2017-05-07 MED ORDER — IBUPROFEN 600 MG PO TABS: 600.0000 mg | ORAL_TABLET | Freq: Four times a day (QID) | ORAL | 0 refills | Status: DC | PRN
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
## 2017-05-07 NOTE — ED Triage Notes (Signed)
Pt reports hx of pancreatitis. Reports back pain since Monday, generalized abd pain with nausea since yesterday.

## 2017-05-07 NOTE — Discharge Instructions (Signed)
You will need to be on 6 weeks of antibiotics for prostatitis. Please have recheck with your PCP on Tuesday. Please return for worsening symptoms, including fever, escalating pain, intractable vomiting or any other symptoms concerning to you.

## 2017-05-07 NOTE — ED Provider Notes (Signed)
[facility] Provider Note   CSN: [id number] Arrival date & time: 05/07/17  1803     History   Chief Complaint Chief Complaint  Patient presents with  . Back Pain  . Abdominal Pain    HPI Frank Soto is a 46 y.o. male.  HPI 46 year old male who presents with groin pain and low back pain. He has a history of appendectomy, cholecystectomy, pancreatitis and recurrent prostatitis. States that 2 days ago he developed typical symptoms of his prostatitis. He is feeding her neurologist in the past was told that this will be a recurrent issue for him. States that he begin to have some tenderness in the left groin and left lower quadrant and mid back. Has had dysuria and urinary frequency. He states that these are his typical symptoms for acute prostatitis. He denies any fevers, chills. Denies any vomiting but has felt nauseated. No diarrhea or constipation.   Past Medical History:  Diagnosis Date  . Arthritis   . Irritable bowel syndrome (IBS) 2012  . Kidney stones   . Migraine   . Pancreatitis   . PTSD (post-traumatic stress disorder)     Patient Active Problem List   Diagnosis Date Noted  . Left leg injury 12/27/2013  . Dyslipidemia 10/21/2013  . HLD (hyperlipidemia) 09/26/2013  . Shortness of breath 09/23/2013  . Chest pain 09/23/2013  . Atelectasis 09/23/2013  . Pre-syncope 09/23/2013  . Migraine 12/15/2012    Past Surgical History:  Procedure Laterality Date  . APPENDECTOMY    . CHOLECYSTECTOMY    . LEFT HEART CATHETERIZATION WITH CORONARY ANGIOGRAM N/A 09/27/2013   Procedure: LEFT HEART CATHETERIZATION WITH CORONARY ANGIOGRAM;  Surgeon: Peter M SwazilandJordan, MD;  [facility];  Service: Cardiovascular;  Laterality: N/A;       Home Medications    Prior to Admission medications   Medication Sig Start Date End Date Taking? Authorizing Provider  ibuprofen (ADVIL,MOTRIN) 600 MG tablet Take 1 tablet (600 mg total) by  mouth every 6 (six) hours as needed. 05/07/17   Lavera GuiseLiu, Tawnee Clegg Duo, MD  levofloxacin (LEVAQUIN) 500 MG tablet Take 1 tablet (500 mg total) by mouth daily. 05/07/17 06/18/17  Lavera GuiseLiu, Ulysess Witz Duo, MD  oxyCODONE-acetaminophen (PERCOCET/ROXICET) 5-325 MG tablet Take 1-2 tablets by mouth every 6 (six) hours as needed for severe pain. 05/07/17   Lavera GuiseLiu, Isais Klipfel Duo, MD    Family History Family History  Problem Relation Age of Onset  . COPD Mother     Social History Social History  Substance Use Topics  . Smoking status: Former Games developermoker  . Smokeless tobacco: Never Used  . Alcohol use No     Allergies   Patient has no known allergies.   Review of Systems Review of Systems  Constitutional: Negative for fever.  Respiratory: Negative for shortness of breath.   Cardiovascular: Negative for chest pain.  Gastrointestinal: Positive for abdominal pain and nausea. Negative for diarrhea.  Genitourinary: Positive for dysuria.  Musculoskeletal: Positive for back pain.  All other systems reviewed and are negative.    Physical Exam Updated Vital Signs BP (!) 135/92   Pulse 77   Temp 97.7 F (36.5 C) (Oral)   Resp 18   Ht 6' (1.829 m)   Wt 89.4 kg (197 lb)   SpO2 95%   BMI 26.72 kg/m   Physical Exam Physical Exam  Nursing note and vitals reviewed. Constitutional: Well developed, well nourished, non-toxic, and in no acute distress Head: Normocephalic and atraumatic.  Mouth/Throat: Oropharynx is clear and moist.  Neck: Normal range of motion. Neck supple.  Cardiovascular: Normal rate and regular rhythm.   Pulmonary/Chest: Effort normal and breath sounds normal.  Abdominal: Soft. There is mild LLQ tenderness. There is no rebound and no guarding. Tender prostate on rectal exam. GU: Normal testicular lie. Normal external genitalia. No scrotal swelling or discoloration. No tenderness to palpation.  Normal bilateral cremasteric reflexes.  Musculoskeletal: Normal range of motion.  Neurological: Alert, no  facial droop, fluent speech, moves all extremities symmetrically Skin: Skin is warm and dry.  Psychiatric: Cooperative   ED Treatments / Results  Labs (all labs ordered are listed, but only abnormal results are displayed) Labs Reviewed  COMPREHENSIVE METABOLIC PANEL - Abnormal; Notable for the following:       Result Value   BUN 23 (*)    All other components within normal limits  LIPASE, BLOOD  CBC  URINALYSIS, ROUTINE W REFLEX MICROSCOPIC    EKG  EKG Interpretation None       Radiology No results found.  Procedures Procedures (including critical care time)  Medications Ordered in ED Medications  ketorolac (TORADOL) injection 30 mg (30 mg Intramuscular Given 05/07/17 2053)  HYDROmorphone (DILAUDID) injection 1 mg (1 mg Intramuscular Given 05/07/17 2053)  ondansetron (ZOFRAN-ODT) disintegrating tablet 4 mg (4 mg Oral Given 05/07/17 2053)     Initial Impression / Assessment and Plan / ED Course  I have reviewed the triage vital signs and the nursing notes.  Pertinent labs & imaging results that were available during my care of the patient were reviewed by me and considered in my medical decision making (see chart for details).     46 year old male who presents with groin pain, low back pain, symptoms that are consistent with flareup of his prostatitis. He is afebrile and hemodynamically stable. Abdomen is soft and benign. Mild left lower quadrant tenderness noted. Unremarkable GU exam. Does have tender prostate on rectal exam. Given that he states this is the same as prior episodes, will treat as prostatitis. Low suspicion for other intraabdominal processes such as diverticulitis or colitis.  UA and blood work are overall reassuring. Started on Levaquin and given pain control. He does have follow-up with PCP next week for recheck. Strict return and follow-up instructions reviewed. He expressed understanding of all discharge instructions and felt comfortable with the plan  of care.   Final Clinical Impressions(s) / ED Diagnoses   Final diagnoses:  Acute prostatitis    New Prescriptions New Prescriptions   IBUPROFEN (ADVIL,MOTRIN) 600 MG TABLET    Take 1 tablet (600 mg total) by mouth every 6 (six) hours as needed.   LEVOFLOXACIN (LEVAQUIN) 500 MG TABLET    Take 1 tablet (500 mg total) by mouth daily.   OXYCODONE-ACETAMINOPHEN (PERCOCET/ROXICET) 5-325 MG TABLET    Take 1-2 tablets by mouth every 6 (six) hours as needed for severe pain.     Lavera Guise, MD 05/07/17 [phone]

## 2017-05-07 NOTE — ED Notes (Signed)
Pt. Ambulated to bathroom with steady gate.

## 2017-05-07 NOTE — ED Notes (Addendum)
Pt. Reports having a flare up of prostatitis. Pt. Reports running out of his medication and is not able to fill prescription until Tuesday. Pt reports nausea and tenderness to left side of abdomen, groin area, and flank area. Pt. Also reports burning during urination.

## 2017-05-08 ENCOUNTER — Telehealth: Payer: Self-pay | Admitting: *Deleted

## 2017-05-08 NOTE — Telephone Encounter (Signed)
Pharmacy called related to Rx: percocet dosage...EDCM clarified with EDP (Lawyer) to change Rx to: 1 tablet q 6 hours.

## 2018-03-19 ENCOUNTER — Observation Stay (HOSPITAL_BASED_OUTPATIENT_CLINIC_OR_DEPARTMENT_OTHER): Payer: BLUE CROSS/BLUE SHIELD

## 2018-03-19 ENCOUNTER — Observation Stay (HOSPITAL_COMMUNITY)
Admission: EM | Admit: 2018-03-19 | Discharge: 2018-03-20 | Disposition: A | Discharge status: Home / Self Care | Payer: BLUE CROSS/BLUE SHIELD | Attending: Internal Medicine | Admitting: Internal Medicine

## 2018-03-19 ENCOUNTER — Emergency Department (HOSPITAL_COMMUNITY): Payer: BLUE CROSS/BLUE SHIELD

## 2018-03-19 ENCOUNTER — Other Ambulatory Visit: Payer: Self-pay

## 2018-03-19 ENCOUNTER — Encounter (HOSPITAL_COMMUNITY): Payer: Self-pay | Admitting: Emergency Medicine

## 2018-03-19 DIAGNOSIS — R072 Precordial pain: Secondary | ICD-10-CM

## 2018-03-19 DIAGNOSIS — R0789 Other chest pain: Principal | ICD-10-CM | POA: Insufficient documentation

## 2018-03-19 DIAGNOSIS — Z79899 Other long term (current) drug therapy: Secondary | ICD-10-CM | POA: Insufficient documentation

## 2018-03-19 DIAGNOSIS — Z87891 Personal history of nicotine dependence: Secondary | ICD-10-CM | POA: Diagnosis not present

## 2018-03-19 DIAGNOSIS — E785 Hyperlipidemia, unspecified: Secondary | ICD-10-CM | POA: Insufficient documentation

## 2018-03-19 DIAGNOSIS — K589 Irritable bowel syndrome without diarrhea: Secondary | ICD-10-CM | POA: Insufficient documentation

## 2018-03-19 DIAGNOSIS — Z23 Encounter for immunization: Secondary | ICD-10-CM | POA: Insufficient documentation

## 2018-03-19 DIAGNOSIS — R079 Chest pain, unspecified: Secondary | ICD-10-CM | POA: Diagnosis present

## 2018-03-19 HISTORY — DX: Other chronic pancreatitis: K86.1

## 2018-03-19 HISTORY — DX: Peptic ulcer, site unspecified, unspecified as acute or chronic, without hemorrhage or perforation: K27.9

## 2018-03-19 HISTORY — DX: Personal history of urinary calculi: Z87.442

## 2018-03-19 HISTORY — DX: Major depressive disorder, single episode, unspecified: F32.9

## 2018-03-19 HISTORY — DX: Depression, unspecified: F32.A

## 2018-03-19 HISTORY — DX: Chondrocostal junction syndrome (tietze): M94.0

## 2018-03-19 HISTORY — DX: Hyperlipidemia, unspecified: E78.5

## 2018-03-19 HISTORY — DX: Gastro-esophageal reflux disease without esophagitis: K21.9

## 2018-03-19 LAB — ECHOCARDIOGRAM COMPLETE
Height: 72 in
Weight: 3439.18 oz

## 2018-03-19 LAB — BASIC METABOLIC PANEL
ANION GAP: 14 (ref 5–15)
BUN: 16 mg/dL (ref 6–20)
CALCIUM: 9.5 mg/dL (ref 8.9–10.3)
CO2: 23 mmol/L (ref 22–32)
Chloride: 103 mmol/L (ref 98–111)
Creatinine, Ser: 1.12 mg/dL (ref 0.61–1.24)
GFR calc Af Amer: >60 mL/min (ref >60)
GFR calc non Af Amer: >60 mL/min (ref >60)
GLUCOSE: 97 mg/dL (ref 70–99)
Potassium: 4 mmol/L (ref 3.5–5.1)
Sodium: 140 mmol/L (ref 135–145)

## 2018-03-19 LAB — CBC
HCT: 50.3 % (ref 39.0–52.0)
HCT: 48.3 % (ref 39.0–52.0)
Hemoglobin: 17.2 g/dL — ABNORMAL HIGH (ref 13.0–17.0)
Hemoglobin: 16.4 g/dL (ref 13.0–17.0)
MCH: 28.9 pg (ref 26.0–34.0)
MCH: 28.8 pg (ref 26.0–34.0)
MCHC: 34.2 g/dL (ref 30.0–36.0)
MCHC: 34 g/dL (ref 30.0–36.0)
MCV: 84.4 fL (ref 78.0–100.0)
MCV: 84.9 fL (ref 78.0–100.0)
Platelets: 293 10*3/uL (ref 150–400)
Platelets: 258 10*3/uL (ref 150–400)
RBC: 5.96 MIL/uL — ABNORMAL HIGH (ref 4.22–5.81)
RBC: 5.69 MIL/uL (ref 4.22–5.81)
RDW: 12.8 % (ref 11.5–15.5)
RDW: 12.8 % (ref 11.5–15.5)
WBC: 8.4 10*3/uL (ref 4.0–10.5)
WBC: 7.8 10*3/uL (ref 4.0–10.5)

## 2018-03-19 LAB — I-STAT TROPONIN, ED: TROPONIN I, POC: 0.01 ng/mL (ref 0.00–0.08)

## 2018-03-19 LAB — D-DIMER, QUANTITATIVE: D-Dimer, Quant: <0.27 ug/mL-FEU (ref 0.00–0.50)

## 2018-03-19 LAB — MAGNESIUM: Magnesium: 2.2 mg/dL (ref 1.7–2.4)

## 2018-03-19 LAB — PHOSPHORUS: Phosphorus: 4 mg/dL (ref 2.5–4.6)

## 2018-03-19 LAB — TROPONIN I: Troponin I: <0.03 ng/mL (ref <0.03)

## 2018-03-19 LAB — CREATININE, SERUM
Creatinine, Ser: 1.08 mg/dL (ref 0.61–1.24)
GFR calc Af Amer: >60 mL/min (ref >60)
GFR calc non Af Amer: >60 mL/min (ref >60)

## 2018-03-19 MED ORDER — ENOXAPARIN SODIUM 40 MG/0.4ML ~~LOC~~ SOLN
40.0000 mg | SUBCUTANEOUS | Status: DC
  Administered 2018-03-19: 40 mg via SUBCUTANEOUS
  Filled 2018-03-19: qty 0.4

## 2018-03-19 MED ORDER — ONDANSETRON HCL 4 MG/2ML IJ SOLN: 4.0000 mg | Freq: Four times a day (QID) | INTRAMUSCULAR | Status: DC | PRN

## 2018-03-19 MED ORDER — SIMVASTATIN 40 MG PO TABS: 40.0000 mg | ORAL_TABLET | Freq: Every day | ORAL | Status: DC

## 2018-03-19 MED ORDER — INFLUENZA VAC SPLIT QUAD 0.5 ML IM SUSY
0.5000 mL | PREFILLED_SYRINGE | INTRAMUSCULAR | Status: AC
  Administered 2018-03-20: 0.5 mL via INTRAMUSCULAR
  Filled 2018-03-19: qty 0.5

## 2018-03-19 MED ORDER — QUETIAPINE FUMARATE 100 MG PO TABS
100.0000 mg | ORAL_TABLET | Freq: Every day | ORAL | Status: DC
  Administered 2018-03-19: 100 mg via ORAL
  Filled 2018-03-19: qty 1

## 2018-03-19 MED ORDER — TRAZODONE HCL 150 MG PO TABS
150.0000 mg | ORAL_TABLET | Freq: Every day | ORAL | Status: DC
  Administered 2018-03-19: 150 mg via ORAL
  Filled 2018-03-19: qty 1

## 2018-03-19 MED ORDER — ASPIRIN EC 81 MG PO TBEC
81.0000 mg | DELAYED_RELEASE_TABLET | Freq: Every day | ORAL | Status: DC
  Administered 2018-03-20: 81 mg via ORAL
  Filled 2018-03-19: qty 1

## 2018-03-19 MED ORDER — ACETAMINOPHEN 325 MG PO TABS: 650.0000 mg | ORAL_TABLET | ORAL | Status: DC | PRN

## 2018-03-19 MED ORDER — ASPIRIN EC 325 MG PO TBEC
325.0000 mg | DELAYED_RELEASE_TABLET | Freq: Once | ORAL | Status: AC
  Administered 2018-03-19: 325 mg via ORAL
  Filled 2018-03-19: qty 1

## 2018-03-19 MED ORDER — BUSPIRONE HCL 5 MG PO TABS
15.0000 mg | ORAL_TABLET | Freq: Two times a day (BID) | ORAL | Status: DC
  Administered 2018-03-19 – 2018-03-20 (×3): 15 mg via ORAL
  Filled 2018-03-19 (×2): qty 1
  Filled 2018-03-19: qty 2

## 2018-03-19 NOTE — ED Notes (Signed)
Admitting MD @ bedside for evaluation

## 2018-03-19 NOTE — ED Notes (Signed)
Pt states his chest feels "tight", worse with inspiration. When asked if he is in pain, he stated no.

## 2018-03-19 NOTE — Progress Notes (Signed)
Patient admitted from ED to 4E room 24.  Pt presented with chest pain/tightness to ED.  First troponin negative.  EKG without abnormality.  Telemetry monitor applied and CCMD notified.  Patient oriented to unit and room to include call light and phone.  Dinner tray ordered.  Will continue to monitor.

## 2018-03-19 NOTE — Consult Note (Signed)
Cardiology Consultation:   Patient ID: Frank Soto; [id number]; 1971-05-16   Admit date: 03/19/2018 Date of Consult: 03/19/2018  Primary Care Provider: [facility] Primary Cardiologist: Dr Tenny Craw   Patient Profile:   Frank Soto is a 47 y.o. male with a hx of PTSD, hyperlipidemia, pancreatitis, migraine headaches, nephrolithiasis, irritable bowel syndrome, arthritis who is being seen today for the evaluation of chest pain at the request of Berton Mount, MD.  History of Present Illness:   Patient has a history of chest pain.  Seen previously by Dr. Tenny Craw.  Cardiac catheterization 2015 showed normal coronary arteries and normal LV function.  Echocardiogram March 2015 showed normal LV function.  Patient states that since Sunday he has had continuous chest tightness that is diffuse in nature.  It increases with certain movements and deep inspiration.  Pain does not radiate.  There is mild diaphoresis but no nausea, vomiting or dyspnea.  The pain has been continuous without complete resolution.  He notes some dyspnea today.  He also had some pain in his neck today and presented to the emergency room for further evaluation.  Cardiology now asked to see.  Past Medical History:  Diagnosis Date  . Arthritis   . Costochondritis   . Hyperlipidemia   . Irritable bowel syndrome (IBS) 2012  . Kidney stones   . Migraine   . Pancreatitis   . Peptic ulcer disease   . PTSD (post-traumatic stress disorder)     Past Surgical History:  Procedure Laterality Date  . APPENDECTOMY    . CHOLECYSTECTOMY    . HERNIA REPAIR    . LEFT HEART CATHETERIZATION WITH CORONARY ANGIOGRAM N/A 09/27/2013   Procedure: LEFT HEART CATHETERIZATION WITH CORONARY ANGIOGRAM;  Surgeon: Peter M Swaziland, MD;  Location: [facility] CATH LAB;  Service: Cardiovascular;  Laterality: N/A;       Inpatient Medications: Scheduled Meds: . aspirin EC  81 mg Oral Daily  . busPIRone  15 mg Oral BID  . enoxaparin (LOVENOX)  injection  40 mg Subcutaneous Q24H  . QUEtiapine  100 mg Oral QHS  . [START ON 03/20/2018] simvastatin  40 mg Oral q1800  . traZODone  150 mg Oral QHS   Continuous Infusions:  PRN Meds: acetaminophen, ondansetron (ZOFRAN) IV  Allergies:   No Known Allergies  Social History:   Social History   Socioeconomic History  . Marital status: Divorced    Spouse name: Not on file  . Number of children: Not on file  . Years of education: Not on file  . Highest education level: Not on file  Occupational History  . Not on file  Social Needs  . Financial resource strain: Not on file  . Food insecurity:    Worry: Not on file    Inability: Not on file  . Transportation needs:    Medical: Not on file    Non-medical: Not on file  Tobacco Use  . Smoking status: Former Games developer  . Smokeless tobacco: Never Used  Substance and Sexual Activity  . Alcohol use: No  . Drug use: No    Comment: quit taking drugs in 2000  . Sexual activity: Not on file  Lifestyle  . Physical activity:    Days per week: Not on file    Minutes per session: Not on file  . Stress: Not on file  Relationships  . Social connections:    Talks on phone: Not on file    Gets together: Not on file  Attends religious service: Not on file    Active member of club or organization: Not on file    Attends meetings of clubs or organizations: Not on file    Relationship status: Not on file  . Intimate partner violence:    Fear of current or ex partner: Not on file    Emotionally abused: Not on file    Physically abused: Not on file    Forced sexual activity: Not on file  Other Topics Concern  . Not on file  Social History Narrative  . Not on file    Family History:    Family History  Problem Relation Age of Onset  . COPD Mother   . Diabetes Mellitus I Father      ROS:  Please see the history of present illness.  Patient with recent headaches but denies fevers, chills, productive cough, hemoptysis, melena or  hematochezia. All other ROS reviewed and negative.     Physical Exam/Data:   Vitals:   03/19/18 1500 03/19/18 1515 03/19/18 1519 03/19/18 1612  BP: 123/87 (!) 112/94    Pulse: (!) 58 64    Resp: 11 16    Temp:    97.8 F (36.6 C)  TempSrc:    Oral  SpO2: 98% 98%    Weight:   97.5 kg 97.5 kg  Height:   6' (1.829 m) 6' (1.829 m)   No intake or output data in the 24 hours ending 03/19/18 1620 Filed Weights   03/19/18 1519 03/19/18 1612  Weight: 97.5 kg 97.5 kg   Body mass index is 29.15 kg/m.  General:  Well nourished, well developed, in no acute distress HEENT: normal Lymph: no adenopathy Neck: no JVD Endocrine:  No thryomegaly Vascular: No carotid bruits; FA pulses 2+ bilaterally without bruits  Cardiac:  normal S1, S2; RRR; no murmur  Lungs:  clear to auscultation bilaterally, no wheezing, rhonchi or rales  Abd: soft, nontender, no hepatomegaly  Ext: no edema Musculoskeletal:  No deformities, BUE and BLE strength normal and equal Skin: warm and dry; tattoos noted Neuro:  CNs 2-12 intact, no focal abnormalities noted Psych:  Normal affect   EKG:  The EKG was personally reviewed and demonstrates: Normal sinus rhythm, left axis deviation, no ST changes.   Laboratory Data:  Chemistry Recent Labs  Lab 03/19/18 1051  NA 140  K 4.0  CL 103  CO2 23  GLUCOSE 97  BUN 16  CREATININE 1.12  CALCIUM 9.5  GFRNONAA >60  GFRAA >60  ANIONGAP 14    Hematology Recent Labs  Lab 03/19/18 1051 03/19/18 1454  WBC 8.4 7.8  RBC 5.96* 5.69  HGB 17.2* 16.4  HCT 50.3 48.3  MCV 84.4 84.9  MCH 28.9 28.8  MCHC 34.2 34.0  RDW 12.8 12.8  PLT 293 258   Cardiac Enzymes Recent Labs  Lab 03/19/18 1349  TROPONINI <0.03    Recent Labs  Lab 03/19/18 1043  TROPIPOC 0.01    DDimer  Recent Labs  Lab 03/19/18 1338  DDIMER <0.27    Radiology/Studies:  Dg Chest 2 View  Result Date: 03/19/2018 CLINICAL DATA:  Patient with chest pain and pressure. EXAM: CHEST - 2 VIEW  COMPARISON:  Chest radiograph 02/09/2016 FINDINGS: Volumes stable cardiac and mediastinal contours. Bibasilar atelectasis. Low lung. No pleural effusion or pneumothorax. Regional skeleton is unremarkable. IMPRESSION: No acute cardiopulmonary process.  Bibasilar atelectasis. Electronically Signed   By: Annia Belt M.D.   On: 03/19/2018 11:52    Assessment  and Plan:   1. Chest pain-symptoms are extremely atypical.  They have been persistent for 5 consecutive days without completely resolving.  They increase with certain movements and inspiration.  They are reproduced with palpation.  Enzymes are negative.  Electrocardiogram shows no ST changes.  He did have a catheterization in 2015 that showed normal coronary arteries.  Given that his symptoms have been persistent for 5 days and troponins are normal he has ruled out.  D-dimer is also normal.  I would not pursue further cardiac evaluation.  Would treat for musculoskeletal pain. 2. Irritable bowel syndrome-management per primary care. 3. History of hyperlipidemia-follow-up with his primary care physician.  [facility] will sign off.   Medication Recommendations: No cardiac medications recommended. Other recommendations (labs, testing, etc): No further cardiac testing recommended. Follow up as an outpatient: No cardiology follow-up recommended.   For questions or updates, please contact [facility] Please consult [email or web address] for contact info under Cardiology/STEMI.   Signed, Olga Millers, MD  03/19/2018 4:20 PM

## 2018-03-19 NOTE — Progress Notes (Signed)
  Echocardiogram 2D Echocardiogram has been performed.  Delcie Roch 03/19/2018, 5:23 PM

## 2018-03-19 NOTE — ED Notes (Signed)
Meal Tray ordered.  

## 2018-03-19 NOTE — ED Provider Notes (Signed)
[facility] EMERGENCY DEPARTMENT Provider Note   CSN: [id number] Arrival date & time: 03/19/18  1033   History   Chief Complaint Chief Complaint  Patient presents with  . Chest Pain    HPI Frank Soto is a 47 y.o. male who presents for evaluation of chest pain x 3 days. States he woke up with chest pressure in the morning which worsened with activity. He became diaphoretic and SOB. States he rested and the symptoms resolved however he still has a dull pressure in the center of his chest and he becomes winded and SOB with activity. States he had something similar to this years ago and was admitted to the hospital and underwent a chemical stress test and heart cath. States he does not think they performed any stints at the time of the cath. Pain is worse with exertion and movement. No SOB at rest. Former smoker. Hx hyperlipemia, not on medication. Denies fever, chills, cough, lower extremity swelling, hx DVT or PE, active malignancy, nausea, vomiting, abdominal pain, recent trauma, recent surgery.   Past Medical History:  Diagnosis Date  . Arthritis   . Irritable bowel syndrome (IBS) 2012  . Kidney stones   . Migraine   . Pancreatitis   . PTSD (post-traumatic stress disorder)     Patient Active Problem List   Diagnosis Date Noted  . Left leg injury 12/27/2013  . Dyslipidemia 10/21/2013  . HLD (hyperlipidemia) 09/26/2013  . Shortness of breath 09/23/2013  . Chest pain 09/23/2013  . Atelectasis 09/23/2013  . Pre-syncope 09/23/2013  . Migraine 12/15/2012    Past Surgical History:  Procedure Laterality Date  . APPENDECTOMY    . CHOLECYSTECTOMY    . LEFT HEART CATHETERIZATION WITH CORONARY ANGIOGRAM N/A 09/27/2013   Procedure: LEFT HEART CATHETERIZATION WITH CORONARY ANGIOGRAM;  Surgeon: Peter M Swaziland, MD;  Location: [facility] CATH LAB;  Service: Cardiovascular;  Laterality: N/A;        Home Medications    Prior to Admission medications   Medication Sig  Start Date End Date Taking? Authorizing Provider  busPIRone (BUSPAR) 15 MG tablet Take 15 mg by mouth 2 (two) times daily. 02/22/18  Yes [provider]  QUEtiapine (SEROQUEL) 100 MG tablet Take 100 mg by mouth at bedtime. 02/22/18  Yes [provider]  simvastatin (ZOCOR) 40 MG tablet Take 40 mg by mouth every morning. 02/12/18  Yes [provider]  traZODone (DESYREL) 150 MG tablet Take 150 mg by mouth at bedtime. 03/18/18  Yes [provider]  ibuprofen (ADVIL,MOTRIN) 600 MG tablet Take 1 tablet (600 mg total) by mouth every 6 (six) hours as needed. Patient not taking: Reported on 03/19/2018 05/07/17   Lavera Guise, MD  oxyCODONE-acetaminophen (PERCOCET/ROXICET) 5-325 MG tablet Take 1-2 tablets by mouth every 6 (six) hours as needed for severe pain. Patient not taking: Reported on 03/19/2018 05/07/17   Lavera Guise, MD    Family History Family History  Problem Relation Age of Onset  . COPD Mother     Social History Social History   Tobacco Use  . Smoking status: Former Games developer  . Smokeless tobacco: Never Used  Substance Use Topics  . Alcohol use: No  . Drug use: No    Comment: quit taking drugs in 2000     Allergies   Patient has no known allergies.   Review of Systems Review of Systems  Systems negative unless otherwise stated in the HPI Physical Exam Updated Vital Signs BP 111/71  Pulse 62   Temp 98.1 F (36.7 C) (Oral)   Resp (!) 9   SpO2 97%   Physical Exam  Constitutional: He appears well-developed and well-nourished.  Non-toxic appearance. He does not appear ill. No distress.  HENT:  Head: Normocephalic and atraumatic.  Eyes: Pupils are equal, round, and reactive to light.  Neck: Normal range of motion. Neck supple. No JVD present. No tracheal deviation present.  Cardiovascular: Normal rate, regular rhythm, normal heart sounds, intact distal pulses and normal pulses.  Pulmonary/Chest: Effort normal and breath sounds normal. No  accessory muscle usage. No respiratory distress.  Chest wall tenderness to palpation  Abdominal: Soft. Bowel sounds are normal. He exhibits no distension. There is no tenderness.  Musculoskeletal: Normal range of motion.       Right lower leg: Normal.       Left lower leg: Normal.  Neurological: He is alert.  Skin: Skin is warm, dry and intact. He is not diaphoretic. No pallor.  Psychiatric: He has a normal mood and affect.  Nursing note and vitals reviewed.    ED Treatments / Results  Labs (all labs ordered are listed, but only abnormal results are displayed) Labs Reviewed  CBC - Abnormal; Notable for the following components:      Result Value   RBC 5.96 (*)    Hemoglobin 17.2 (*)    All other components within normal limits  BASIC METABOLIC PANEL  D-DIMER, QUANTITATIVE (NOT AT [facility])  I-STAT TROPONIN, ED    EKG EKG Interpretation  Date/Time:  Thursday March 19 2018 10:42:52 EDT Ventricular Rate:  75 PR Interval:  142 QRS Duration: 92 QT Interval:  380 QTC Calculation: 424 R Axis:   -32 Text Interpretation:  Normal sinus rhythm Left axis deviation Abnormal ECG Confirmed by Kennis Carina [phone]) on 03/19/2018 12:39:03 PM   Radiology Dg Chest 2 View  Result Date: 03/19/2018 CLINICAL DATA:  Patient with chest pain and pressure. EXAM: CHEST - 2 VIEW COMPARISON:  Chest radiograph 02/09/2016 FINDINGS: Volumes stable cardiac and mediastinal contours. Bibasilar atelectasis. Low lung. No pleural effusion or pneumothorax. Regional skeleton is unremarkable. IMPRESSION: No acute cardiopulmonary process.  Bibasilar atelectasis. Electronically Signed   By: Annia Belt M.D.   On: 03/19/2018 11:52    Procedures Procedures (including critical care time)  Medications Ordered in ED Medications  aspirin EC tablet 325 mg (325 mg Oral Given 03/19/18 1243)     Initial Impression / Assessment and Plan / ED Course  I have reviewed the triage vital signs and the nursing notes also past  medical history.  Pertinent labs & imaging results that were available during my care of the patient were reviewed by me and considered in my medical decision making (see chart for details).  Presents for evaluation of chest pain x3 days.  Had previous heart cath and chemical stress test in 2015.  Abnormal stress test however heart cath negative at that time.  Will obtain labs, EKG and reevaluate for acute cardiac event versus chest wall pain. Heart score 4. D-dimer pending on admission  Concern for cardiac etiology of Chest Pain. Hospitalist have been consulted for chest pain r/o.   Consulted with Dr. Dartha Lodge who agrees to admit for chest pain r/o.  Pt does not meet criteria for CP protocol and a further evaluation is recommended. Pt has been re-evaluated prior to consult and VSS, NAD, heart RRR, pain 0/10, lungs CTAB. No acute abnormalities found on EKG and first round of cardiac enzymes negative.  This case was discussed with Dr. Pilar Plate who has seen the patient and agrees with plan to admit.     Final Clinical Impressions(s) / ED Diagnoses   Final diagnoses:  Chest pain    ED Discharge Orders    None        Ashtin Melichar A, PA-C 03/19/18 1409    Sabas Sous, MD 03/19/18 2308

## 2018-03-19 NOTE — ED Notes (Signed)
Pharmacy notified to verify medication 

## 2018-03-19 NOTE — ED Triage Notes (Signed)
Pt arrives to the ED with c/o of chest pain that started 3 days ago upon waking up. Pt reports episodes of nausea, diaphoresis. Pt also states over the last 2 days he has been having headaches.

## 2018-03-19 NOTE — H&P (Signed)
History and Physical  Frank Soto ZOX:[id number] DOB: Dec 18, 1970 DOA: 03/19/2018  Referring physician: ER physician PCP: Care, [facility]  Outpatient Specialists: Patient will be seen by a psychiatrist very soon Patient coming from: Home  Chief Complaint: Chest pain  HPI: Patient is a 47 year old male with past medical history significant for PTSD, hyperlipidemia pancreatitis, migraine, nephrolithiasis, irritable bowel syndrome and arthritis.  According to the patient, he has arthritis of the sternal bone.  Patient was also said to have had cardiac catheterization between 2059 2017 without any significant finding noted.  Specifically, patient denies history of diabetes mellitus or hypertension.  No family history of myocardial infarction or coronary artery disease.  Patient is currently undergoing separation/divorce from the wife.  Patient presents with a 2-day history of central chest pain, constant, with only mild variation in intensity of the pain, pressure-like, with some sharp component that radiates to the neck.  Diaphoresis is reported.  No associated nausea or vomiting no associated shortness of breath.  First troponin is within normal range.  EKG has not shown any ST-T wave changes.  No headache, no neck pain, no fever chills, no shortness of breath, no GI symptoms and no urinary symptoms.  Hospitalist team has been asked to admit patient for further assessment and management of the chest pain.  ED Course: On presentation to the ER, Vitals revealed temperature of 98.1, blood pressure 121/91 mmHg, heart rate thousand and 6 with a respiratory rate of 15.  Labs done in the ER revealed sodium of 140, potassium of 4, chloride 103, CO2 23, BUN of 69 creatinine of 1.12.  CBC reveals WBC of 8.4, hemoglobin of 17.3, hematocrit 3050.3, MCV of 84.4 platelet count of 293. Pertinent labs:  EKG: Independently reviewed.  Imaging: independently reviewed.  Review of Systems:  Negative for fever,  visual changes, sore throat, rash, new muscle aches, SOB, dysuria, bleeding, n/v/abdominal pain.  Past Medical History:  Diagnosis Date  . Arthritis   . Irritable bowel syndrome (IBS) 2012  . Kidney stones   . Migraine   . Pancreatitis   . PTSD (post-traumatic stress disorder)     Past Surgical History:  Procedure Laterality Date  . APPENDECTOMY    . CHOLECYSTECTOMY    . LEFT HEART CATHETERIZATION WITH CORONARY ANGIOGRAM N/A 09/27/2013   Procedure: LEFT HEART CATHETERIZATION WITH CORONARY ANGIOGRAM;  Surgeon: Peter M Swaziland, MD;  Location: [facility] CATH LAB;  Service: Cardiovascular;  Laterality: N/A;     reports that he has quit smoking. He has never used smokeless tobacco. He reports that he does not drink alcohol or use drugs.  No Known Allergies  Family History  Problem Relation Age of Onset  . COPD Mother      Prior to Admission medications   Medication Sig Start Date End Date Taking? Authorizing Provider  busPIRone (BUSPAR) 15 MG tablet Take 15 mg by mouth 2 (two) times daily. 02/22/18  Yes [provider]  QUEtiapine (SEROQUEL) 100 MG tablet Take 100 mg by mouth at bedtime. 02/22/18  Yes [provider]  simvastatin (ZOCOR) 40 MG tablet Take 40 mg by mouth every morning. 02/12/18  Yes [provider]  traZODone (DESYREL) 150 MG tablet Take 150 mg by mouth at bedtime. 03/18/18  Yes [provider]    Physical Exam: Vitals:   03/19/18 1143 03/19/18 1300 03/19/18 1339 03/19/18 1343  BP: (!) 117/91 111/71 (!) 121/91 (!) 121/91  Pulse: 69 62 71 76  Resp: 14 (!) 9 16  15  Temp: 98.1 F (36.7 C)     TempSrc: Oral     SpO2: 97%   96%    Constitutional:  . Appears calm and comfortable.  Flat affect with minimal reactivity. Eyes:  . No pallor. No jaundice.  ENMT:  . external ears, nose appear normal Neck:  . Neck is supple. No JVD Respiratory:  . CTA bilaterally, no w/r/r.  . Respiratory effort normal. No retractions or accessory muscle  use Cardiovascular:  . S1S2 . No LE extremity edema   Abdomen:  . Abdomen is soft and non tender. Organs are difficult to assess. Neurologic:  . Awake and alert. . Moves all limbs.  Wt Readings from Last 3 Encounters:  05/07/17 89.4 kg  02/09/16 84.8 kg  12/16/15 86.2 kg    I have personally reviewed following labs and imaging studies  Labs on Admission:  CBC: Recent Labs  Lab 03/19/18 1051  WBC 8.4  HGB 17.2*  HCT 50.3  MCV 84.4  PLT 293   Basic Metabolic Panel: Recent Labs  Lab 03/19/18 1051  NA 140  K 4.0  CL 103  CO2 23  GLUCOSE 97  BUN 16  CREATININE 1.12  CALCIUM 9.5   Liver Function Tests: No results for input(s): AST, ALT, ALKPHOS, BILITOT, PROT, ALBUMIN in the last 168 hours. No results for input(s): LIPASE, AMYLASE in the last 168 hours. No results for input(s): AMMONIA in the last 168 hours. Coagulation Profile: No results for input(s): INR, PROTIME in the last 168 hours. Cardiac Enzymes: No results for input(s): CKTOTAL, CKMB, CKMBINDEX, TROPONINI in the last 168 hours. BNP (last 3 results) No results for input(s): PROBNP in the last 8760 hours. HbA1C: No results for input(s): HGBA1C in the last 72 hours. CBG: No results for input(s): GLUCAP in the last 168 hours. Lipid Profile: No results for input(s): CHOL, HDL, LDLCALC, TRIG, CHOLHDL, LDLDIRECT in the last 72 hours. Thyroid Function Tests: No results for input(s): TSH, T4TOTAL, FREET4, T3FREE, THYROIDAB in the last 72 hours. Anemia Panel: No results for input(s): VITAMINB12, FOLATE, FERRITIN, TIBC, IRON, RETICCTPCT in the last 72 hours. Urine analysis:    Component Value Date/Time   COLORURINE YELLOW 05/07/2017 2156   APPEARANCEUR CLEAR 05/07/2017 2156   LABSPEC 1.023 05/07/2017 2156   PHURINE 5.0 05/07/2017 2156   GLUCOSEU NEGATIVE 05/07/2017 2156   HGBUR NEGATIVE 05/07/2017 2156   BILIRUBINUR NEGATIVE 05/07/2017 2156   KETONESUR NEGATIVE 05/07/2017 2156   PROTEINUR NEGATIVE  05/07/2017 2156   NITRITE NEGATIVE 05/07/2017 2156   LEUKOCYTESUR NEGATIVE 05/07/2017 2156   Sepsis Labs: @LABRCNTIP (procalcitonin:4,lacticidven:4) )No results found for this or any previous visit (from the past 240 hour(s)).    Radiological Exams on Admission: Dg Chest 2 View  Result Date: 03/19/2018 CLINICAL DATA:  Patient with chest pain and pressure. EXAM: CHEST - 2 VIEW COMPARISON:  Chest radiograph 02/09/2016 FINDINGS: Volumes stable cardiac and mediastinal contours. Bibasilar atelectasis. Low lung. No pleural effusion or pneumothorax. Regional skeleton is unremarkable. IMPRESSION: No acute cardiopulmonary process.  Bibasilar atelectasis. Electronically Signed   By: Annia Belt M.D.   On: 03/19/2018 11:52    EKG: Independently reviewed.   Active Problems:   Chest pain   Assessment/Plan 1. Chest pain, with typical and atypical features. 2. Hyperlipidemia. 3. Depressive disorder versus adjustment disorder with depressive response.   Admit patient to the office for further assessment and management.  Cycle patient's cardiac enzymes.    Echocardiogram  Low threshold consult cardiology team  Fasting  lipid profile  Patient has out of patient follow with psychiatrist.    Will check fasting lipid profile  DVT prophylaxis: Subcu Lovenox Code Status: Full Family Communication:  Disposition Plan:    Consults called: Cardiology Admission status: Observation  Time spent: 65 minutes minutes  Berton Mount, MD  [facility] Pager #: [phone] 7PM-7AM contact night coverage as above   03/19/2018, 2:00 PM

## 2018-03-20 DIAGNOSIS — R0789 Other chest pain: Secondary | ICD-10-CM | POA: Diagnosis not present

## 2018-03-20 LAB — RAPID URINE DRUG SCREEN, HOSP PERFORMED
Amphetamines: NOT DETECTED
Barbiturates: NOT DETECTED
Benzodiazepines: NOT DETECTED
Cocaine: NOT DETECTED
Opiates: NOT DETECTED
Tetrahydrocannabinol: NOT DETECTED

## 2018-03-20 LAB — BASIC METABOLIC PANEL
Anion gap: 11 (ref 5–15)
BUN: 16 mg/dL (ref 6–20)
CO2: 24 mmol/L (ref 22–32)
Calcium: 9 mg/dL (ref 8.9–10.3)
Chloride: 103 mmol/L (ref 98–111)
Creatinine, Ser: 1.15 mg/dL (ref 0.61–1.24)
GFR calc Af Amer: >60 mL/min (ref >60)
GFR calc non Af Amer: >60 mL/min (ref >60)
Glucose, Bld: 99 mg/dL (ref 70–99)
Potassium: 3.8 mmol/L (ref 3.5–5.1)
Sodium: 138 mmol/L (ref 135–145)

## 2018-03-20 LAB — HIV ANTIBODY (ROUTINE TESTING W REFLEX): HIV Screen 4th Generation wRfx: NONREACTIVE

## 2018-03-20 MED ORDER — ACETAMINOPHEN 325 MG PO TABS: 650.0000 mg | ORAL_TABLET | Freq: Four times a day (QID) | ORAL | 2 refills | Status: AC

## 2018-03-20 NOTE — Discharge Instructions (Signed)
Chest Wall Pain °Chest wall pain is pain in or around the bones and muscles of your chest. Sometimes, an injury causes this pain. Sometimes, the cause may not be known. This pain may take several weeks or longer to get better. °Follow these instructions at home: °Pay attention to any changes in your symptoms. Take these actions to help with your pain: °· Rest as told by your health care provider. °· Avoid activities that cause pain. These include any activities that use your chest muscles or your abdominal and side muscles to lift heavy items. °· If directed, apply ice to the painful area: °? Put ice in a plastic bag. °? Place a towel between your skin and the bag. °? Leave the ice on for 20 minutes, 2-3 times per day. °· Take over-the-counter and prescription medicines only as told by your health care provider. °· Do not use tobacco products, including cigarettes, chewing tobacco, and e-cigarettes. If you need help quitting, ask your health care provider. °· Keep all follow-up visits as told by your health care provider. This is important. ° °Contact a health care provider if: °· You have a fever. °· Your chest pain becomes worse. °· You have new symptoms. °Get help right away if: °· You have nausea or vomiting. °· You feel sweaty or light-headed. °· You have a cough with phlegm (sputum) or you cough up blood. °· You develop shortness of breath. °This information is not intended to replace advice given to you by your health care provider. Make sure you discuss any questions you have with your health care provider. °Document Released: 07/01/2005 Document Revised: 11/09/2015 Document Reviewed: 09/26/2014 °Elsevier Interactive Patient Education © 2018 [employer] ° °

## 2018-03-20 NOTE — Discharge Summary (Signed)
Physician Discharge Summary  Frank Soto RAQ:[id number] DOB: 1971-06-05 DOA: 03/19/2018  PCP: [facility] Admit date: 03/19/2018 Discharge date: 03/20/2018  Admitted From: Home Disposition: Home  Recommendations for Outpatient Follow-up:  1. Follow up with PCP in 1-2 weeks at [facility] in [facility]: No Equipment/Devices: None  Discharge Condition: Stable CODE STATUS: Full code Diet recommendation: Regular  Brief/Interim Summary: Patient has a history of chest pain.  Seen previously by Dr. Tenny Craw.  Cardiac catheterization 2015 showed normal coronary arteries and normal LV function.  Echocardiogram March 2015 showed normal LV function.  Patient states that since Sunday he has had continuous chest tightness that is diffuse in nature.  It increases with certain movements and deep inspiration.  Pain does not radiate.  There is mild diaphoresis but no nausea, vomiting or dyspnea.  The pain has been continuous without complete resolution.  He notes some dyspnea today.  He also had some pain in his neck today and presented to the emergency room for further evaluation.  Chest pain found to be persistent for 5 days with normal enzymes.  Catheterization in 2015 showed normal coronary arteries.  Given that his symptoms have been present for 5 days and troponins are normal he has ruled out.  Cardiology recommended treating for musculoskeletal pain.  Patient is instructed to take Tylenol 650 mg p.o. every 6 hours for 5 days then discontinue  He had no further recommendations.  Did not recommend cardiology follow-up or further cardiac testing.  Patient has reached maximal benefit of hospitalization.  Discharge diagnosis, prognosis, plans, follow-up, medications and treatments discussed with the patient(or responsible party) and is in agreement with the plans as described.  Patient is stable for discharge.  Discharge Diagnoses:  Active Problems:   Musculoskeletal chest  pain    Discharge Instructions  Discharge Instructions    Diet general   Complete by:  As directed    Increase activity slowly   Complete by:  As directed      Allergies as of 03/20/2018   No Known Allergies     Medication List    TAKE these medications   acetaminophen 325 MG tablet Commonly known as:  TYLENOL Take 2 tablets (650 mg total) by mouth every 6 (six) hours for 5 days.   busPIRone 15 MG tablet Commonly known as:  BUSPAR Take 15 mg by mouth 2 (two) times daily.   QUEtiapine 100 MG tablet Commonly known as:  SEROQUEL Take 100 mg by mouth at bedtime.   simvastatin 40 MG tablet Commonly known as:  ZOCOR Take 40 mg by mouth every morning.   traZODone 150 MG tablet Commonly known as:  DESYREL Take 150 mg by mouth at bedtime.      Follow-up Information    [facility], [facility]. Schedule an appointment as soon as possible for a visit in 1 week(s).   Specialty:  [facility] Why:  Make appointment to be seen in 1 week Contact information: [address] [place] [address] [phone]          No Known Allergies  Consultations:  Cardiology [facility]: Dr. Jens Som   Procedures/Studies: Dg Chest 2 View  Result Date: 03/19/2018 CLINICAL DATA:  Patient with chest pain and pressure. EXAM: CHEST - 2 VIEW COMPARISON:  Chest radiograph 02/09/2016 FINDINGS: Volumes stable cardiac and mediastinal contours. Bibasilar atelectasis. Low lung. No pleural effusion or pneumothorax. Regional skeleton is unremarkable. IMPRESSION: No acute cardiopulmonary process.  Bibasilar atelectasis. Electronically Signed   By: Annia Belt M.D.   On: 03/19/2018 11:52    Echocardiogram: - Left ventricle: The cavity size was normal. Systolic function was   normal. The estimated ejection fraction was in the range of 60%   to 65%. Wall motion was normal; there were no regional wall   motion abnormalities. Left ventricular diastolic function   parameters were normal. -  Aortic valve: Trileaflet; normal thickness leaflets. There was no   regurgitation. - Aortic root: The aortic root was normal in size. - Right ventricle: The cavity size was normal. Wall thickness was   normal. Systolic function was normal. - Tricuspid valve: There was no regurgitation. - Pulmonary arteries: Systolic pressure was within the normal   range. - Inferior vena cava: The vessel was normal in size. - Pericardium, extracardiac: There was no pericardial effusion.    Subjective: Patient stable has had no further discomfort.  Discharge Exam: Vitals:   03/19/18 2011 03/20/18 0417  BP: 121/76 106/64  Pulse: 69 74  Resp:    Temp: 97.7 F (36.5 C) (!) 97.3 F (36.3 C)  SpO2: 95% 95%   Vitals:   03/19/18 1519 03/19/18 1612 03/19/18 2011 03/20/18 0417  BP:  (!) 120/94 121/76 106/64  Pulse:  72 69 74  Resp:  18    Temp:  97.8 F (36.6 C) 97.7 F (36.5 C) (!) 97.3 F (36.3 C)  TempSrc:  Oral Oral Oral  SpO2:  97% 95% 95%  Weight: 97.5 kg 97.5 kg    Height: 6' (1.829 m) 6' (1.829 m)      General: Pt is alert, awake, not in acute distress Cardiovascular: RRR, S1/S2 +, no rubs, no gallops Respiratory: CTA bilaterally, no wheezing, no rhonchi Abdominal: Soft, NT, ND, bowel sounds + Extremities: no edema, no cyanosis    The results of significant diagnostics from this hospitalization (including imaging, microbiology, ancillary and laboratory) are listed below for reference.     Microbiology: No results found for this or any previous visit (from the past 240 hour(s)).   Labs: BNP (last 3 results) No results for input(s): BNP in the last 8760 hours. Basic Metabolic Panel: Recent Labs  Lab 03/19/18 1051 03/19/18 1454 03/19/18 1630 03/20/18 0309  NA 140  --   --  138  K 4.0  --   --  3.8  CL 103  --   --  103  CO2 23  --   --  24  GLUCOSE 97  --   --  99  BUN 16  --   --  16  CREATININE 1.12 1.08  --  1.15  CALCIUM 9.5  --   --  9.0  MG  --   --  2.2  --    PHOS  --  4.0  --   --    Liver Function Tests: No results for input(s): AST, ALT, ALKPHOS, BILITOT, PROT, ALBUMIN in the last 168 hours. No results for input(s): LIPASE, AMYLASE in the last 168 hours. No results for input(s): AMMONIA in the last 168 hours. CBC: Recent Labs  Lab 03/19/18 1051 03/19/18 1454  WBC 8.4 7.8  HGB 17.2* 16.4  HCT 50.3 48.3  MCV 84.4 84.9  PLT 293 258   Cardiac Enzymes: Recent Labs  Lab 03/19/18 1349  TROPONINI <0.03   BNP: Invalid input(s): POCBNP CBG: No results for input(s): GLUCAP in the last 168 hours. D-Dimer Recent Labs    03/19/18 1338  DDIMER <  0.27   Hgb A1c No results for input(s): HGBA1C in the last 72 hours. Lipid Profile No results for input(s): CHOL, HDL, LDLCALC, TRIG, CHOLHDL, LDLDIRECT in the last 72 hours. Thyroid function studies No results for input(s): TSH, T4TOTAL, T3FREE, THYROIDAB in the last 72 hours.  Invalid input(s): FREET3 Anemia work up No results for input(s): VITAMINB12, FOLATE, FERRITIN, TIBC, IRON, RETICCTPCT in the last 72 hours. Urinalysis    Component Value Date/Time   COLORURINE YELLOW 05/07/2017 2156   APPEARANCEUR CLEAR 05/07/2017 2156   LABSPEC 1.023 05/07/2017 2156   PHURINE 5.0 05/07/2017 2156   GLUCOSEU NEGATIVE 05/07/2017 2156   HGBUR NEGATIVE 05/07/2017 2156   BILIRUBINUR NEGATIVE 05/07/2017 2156   KETONESUR NEGATIVE 05/07/2017 2156   PROTEINUR NEGATIVE 05/07/2017 2156   NITRITE NEGATIVE 05/07/2017 2156   LEUKOCYTESUR NEGATIVE 05/07/2017 2156   Sepsis Labs Invalid input(s): PROCALCITONIN,  WBC,  LACTICIDVEN Microbiology No results found for this or any previous visit (from the past 240 hour(s)).   Time coordinating discharge: 45 minutes  SIGNED:   Lahoma Crocker, MD  FACP [facility] 03/20/2018, 10:37 AM Pager   If 7PM-7AM, please contact night-coverage [email or web address] Password [email or web address]

## 2018-06-25 ENCOUNTER — Encounter (HOSPITAL_BASED_OUTPATIENT_CLINIC_OR_DEPARTMENT_OTHER): Payer: Self-pay

## 2018-06-25 ENCOUNTER — Emergency Department (HOSPITAL_BASED_OUTPATIENT_CLINIC_OR_DEPARTMENT_OTHER): Payer: 59

## 2018-06-25 ENCOUNTER — Emergency Department (HOSPITAL_BASED_OUTPATIENT_CLINIC_OR_DEPARTMENT_OTHER)
Admission: EM | Admit: 2018-06-25 | Discharge: 2018-06-25 | Disposition: A | Discharge status: Home / Self Care | Payer: 59 | Attending: Emergency Medicine | Admitting: Emergency Medicine

## 2018-06-25 ENCOUNTER — Other Ambulatory Visit: Payer: Self-pay

## 2018-06-25 DIAGNOSIS — Z9049 Acquired absence of other specified parts of digestive tract: Secondary | ICD-10-CM | POA: Insufficient documentation

## 2018-06-25 DIAGNOSIS — F329 Major depressive disorder, single episode, unspecified: Secondary | ICD-10-CM | POA: Diagnosis not present

## 2018-06-25 DIAGNOSIS — Z79899 Other long term (current) drug therapy: Secondary | ICD-10-CM | POA: Insufficient documentation

## 2018-06-25 DIAGNOSIS — Z87891 Personal history of nicotine dependence: Secondary | ICD-10-CM | POA: Insufficient documentation

## 2018-06-25 DIAGNOSIS — R059 Cough, unspecified: Secondary | ICD-10-CM

## 2018-06-25 DIAGNOSIS — R0602 Shortness of breath: Secondary | ICD-10-CM

## 2018-06-25 DIAGNOSIS — R05 Cough: Secondary | ICD-10-CM | POA: Diagnosis not present

## 2018-06-25 LAB — CBC
HCT: 46.6 % (ref 39.0–52.0)
HEMOGLOBIN: 16.2 g/dL (ref 13.0–17.0)
MCH: 28.9 pg (ref 26.0–34.0)
MCHC: 34.8 g/dL (ref 30.0–36.0)
MCV: 83.1 fL (ref 80.0–100.0)
NRBC: 0 % (ref 0.0–0.2)
Platelets: 320 10*3/uL (ref 150–400)
RBC: 5.61 MIL/uL (ref 4.22–5.81)
RDW: 12.6 % (ref 11.5–15.5)
WBC: 6.9 10*3/uL (ref 4.0–10.5)

## 2018-06-25 LAB — BASIC METABOLIC PANEL
Anion gap: 9 (ref 5–15)
BUN: 15 mg/dL (ref 6–20)
CO2: 22 mmol/L (ref 22–32)
Calcium: 9 mg/dL (ref 8.9–10.3)
Chloride: 105 mmol/L (ref 98–111)
Creatinine, Ser: 1.11 mg/dL (ref 0.61–1.24)
GFR calc Af Amer: >60 mL/min (ref >60)
GFR calc non Af Amer: >60 mL/min (ref >60)
Glucose, Bld: 99 mg/dL (ref 70–99)
Potassium: 3.9 mmol/L (ref 3.5–5.1)
Sodium: 136 mmol/L (ref 135–145)

## 2018-06-25 LAB — D-DIMER, QUANTITATIVE: D-Dimer, Quant: 0.6 ug/mL-FEU — ABNORMAL HIGH (ref 0.00–0.50)

## 2018-06-25 LAB — TROPONIN I

## 2018-06-25 MED ORDER — IOPAMIDOL (ISOVUE-370) INJECTION 76%
100.0000 mL | Freq: Once | INTRAVENOUS | Status: AC | PRN
  Administered 2018-06-25: 100 mL via INTRAVENOUS

## 2018-06-25 NOTE — ED Provider Notes (Signed)
[facility] EMERGENCY DEPARTMENT Provider Note   CSN: [id number] Arrival date & time: 06/25/18  1454     History   Chief Complaint Chief Complaint  Patient presents with  . Shortness of Breath    HPI Frank Soto is a 47 y.o. male.  47yo M w/ PMH including chronic pancreatitis, GERD, PUD, IBS, migraines, PTSD, costochondritis who p/w shortness of breath. 2-3 weeks ago, went to PCP for cough, chills, congestion. He was given antibiotics, albuterol inhaler, and steroids. He started feeling better but he could tell that "my chest would close up on me when I was talking." Got worse last week, so he called PCP who prescribed him another inhaler but insurance wouldn't cover it. Today he feels like he has been fighting all day to breathe. He reports non-productive cough when he breathes in deeply. He reports body aches, lightheaded. He reports "my lungs have been hurting." No improvement when using albuterol; has used it 6 times today without relief. No fevers, vomiting, diarrhea, leg swelling. No recent travel, h/o cancer, or h/o blood clots.   The history is provided by the patient.  Shortness of Breath     Past Medical History:  Diagnosis Date  . Arthritis    "chest" (03/19/2018)  . Chronic pancreatitis (HCC)   . Costochondritis   . Depression   . GERD (gastroesophageal reflux disease)   . History of kidney stones   . Hyperlipidemia   . Irritable bowel syndrome (IBS) 2012  . Migraine    "a few/year" (03/19/2018)  . Peptic ulcer disease   . PTSD (post-traumatic stress disorder)     Patient Active Problem List   Diagnosis Date Noted  . Left leg injury 12/27/2013  . Dyslipidemia 10/21/2013  . HLD (hyperlipidemia) 09/26/2013  . Shortness of breath 09/23/2013  . Musculoskeletal chest pain 09/23/2013  . Atelectasis 09/23/2013  . Pre-syncope 09/23/2013  . Migraine 12/15/2012    Past Surgical History:  Procedure Laterality Date  . APPENDECTOMY    .  CHOLECYSTECTOMY    . INGUINAL HERNIA REPAIR Left 1971  . KNEE ARTHROSCOPY Right 2000   "took piece of cartilage out of it"  . LEFT HEART CATHETERIZATION WITH CORONARY ANGIOGRAM N/A 09/27/2013   Procedure: LEFT HEART CATHETERIZATION WITH CORONARY ANGIOGRAM;  Surgeon: Peter M SwazilandJordan, MD;  Location: [facility] CATH LAB;  Service: Cardiovascular;  Laterality: N/A;        Home Medications    Prior to Admission medications   Medication Sig Start Date End Date Taking? Authorizing Provider  ALPRAZolam (XANAX PO) Take by mouth.   Yes [provider]  busPIRone (BUSPAR) 15 MG tablet Take 15 mg by mouth 2 (two) times daily. 02/22/18   [provider]  QUEtiapine (SEROQUEL) 100 MG tablet Take 100 mg by mouth at bedtime. 02/22/18   [provider]  simvastatin (ZOCOR) 40 MG tablet Take 40 mg by mouth every morning. 02/12/18   [provider]  traZODone (DESYREL) 150 MG tablet Take 150 mg by mouth at bedtime. 03/18/18   [provider]    Family History Family History  Problem Relation Age of Onset  . COPD Mother   . Diabetes Mellitus I Father     Social History Social History   Tobacco Use  . Smoking status: Former Smoker    Packs/day: 3.00    Years: 8.00    Pack years: 24.00    Types: Cigarettes  . Smokeless tobacco: Never Used  . Tobacco comment: 03/19/2018 '  quit smoking cigarettes in 1986"  Substance Use Topics  . Alcohol use: No    Comment: 03/19/2018 "nothing since 2001"  . Drug use: Not Currently    Comment: 03/19/2018 "nothing since 2001"     Allergies   Patient has no known allergies.   Review of Systems Review of Systems  Respiratory: Positive for shortness of breath.    All other systems reviewed and are negative except that which was mentioned in HPI    Physical Exam Updated Vital Signs BP 128/80   Pulse 84   Temp 98 F (36.7 C) (Oral)   Resp 14   Ht 6' (1.829 m)   Wt 95.7 kg   SpO2 98%   BMI 28.62 kg/m   Physical  Exam Vitals signs and nursing note reviewed.  Constitutional:      General: He is not in acute distress.    Appearance: He is well-developed.  HENT:     Head: Normocephalic and atraumatic.  Eyes:     Conjunctiva/sclera: Conjunctivae normal.  Neck:     Musculoskeletal: Neck supple.  Cardiovascular:     Rate and Rhythm: Normal rate and regular rhythm.     Heart sounds: Normal heart sounds. No murmur.  Pulmonary:     Breath sounds: Normal breath sounds.     Comments: Some tachypnea and breathlessness that occurs while talking but normal WOB at rest Abdominal:     General: Bowel sounds are normal. There is no distension.     Palpations: Abdomen is soft.     Tenderness: There is no abdominal tenderness.  Skin:    General: Skin is warm and dry.  Neurological:     Mental Status: He is alert and oriented to person, place, and time.     Comments: Fluent speech  Psychiatric:        Mood and Affect: Mood is anxious.        Judgment: Judgment normal.      ED Treatments / Results  Labs (all labs ordered are listed, but only abnormal results are displayed) Labs Reviewed  D-DIMER, QUANTITATIVE (NOT AT [facility]) - Abnormal; Notable for the following components:      Result Value   D-Dimer, Quant 0.60 (*)    All other components within normal limits  BASIC METABOLIC PANEL  CBC  TROPONIN I    EKG EKG Interpretation  Date/Time:  Thursday June 25 2018 15:21:54 EST Ventricular Rate:  79 PR Interval:  130 QRS Duration: 92 QT Interval:  372 QTC Calculation: 426 R Axis:   -20 Text Interpretation:  Normal sinus rhythm Normal ECG artifact but otherwise similar to previous Confirmed by Frederick Peers [phone]) on 06/25/2018 3:29:07 PM Also confirmed by Frederick Peers [phone]), editor Barbette Hair [phone])  on 06/25/2018 3:43:07 PM   Radiology Dg Chest 2 View  Result Date: 06/25/2018 CLINICAL DATA:  Shortness of breath, chest pain EXAM: CHEST - 2 VIEW COMPARISON:  03/19/2018 FINDINGS:  The heart size and mediastinal contours are within normal limits. Both lungs are clear. The visualized skeletal structures are unremarkable. IMPRESSION: No active cardiopulmonary disease. Electronically Signed   By: Elige Ko   On: 06/25/2018 15:42   Ct Angio Chest Pe W/cm &/or Wo Cm  Result Date: 06/25/2018 CLINICAL DATA:  Persistent shortness of breath. Recent influenza. Chest pressure. EXAM: CT ANGIOGRAPHY CHEST WITH CONTRAST TECHNIQUE: Multidetector CT imaging of the chest was performed using the standard protocol during bolus administration of intravenous contrast. Multiplanar CT image reconstructions and MIPs  were obtained to evaluate the vascular anatomy. CONTRAST:  ISOVUE-370 IOPAMIDOL (ISOVUE-370) INJECTION 76% COMPARISON:  Radiography same day. Previous CT 09/23/2013. FINDINGS: Cardiovascular: Heart size is normal. The aorta is normal. Pulmonary arterial opacification is good. There are no pulmonary emboli. No coronary artery calcification is seen. Mediastinum/Nodes: Normal Lungs/Pleura: The right lung is clear. The left lung is clear except for very minimal atelectasis or scarring in the left lower lobe and lingula. No pleural fluid. Upper Abdomen: Normal Musculoskeletal: Normal Review of the MIP images confirms the above findings. IMPRESSION: No pulmonary emboli or other acute vascular pathology. Minimal atelectasis or scarring in the left lower lobe and lingula. Electronically Signed   By: Paulina Fusi M.D.   On: 06/25/2018 18:24    Procedures Procedures (including critical care time)  Medications Ordered in ED Medications  iopamidol (ISOVUE-370) 76 % injection 100 mL (100 mLs Intravenous Contrast Given 06/25/18 1710)     Initial Impression / Assessment and Plan / ED Course  I have reviewed the triage vital signs and the nursing notes.  Pertinent labs & imaging results that were available during my care of the patient were reviewed by me and considered in my medical decision  making (see chart for details).    Well-appearing on exam, reassuring vital signs, normal oxygen level.  No wheezes on exam.  Chest x-ray negative acute.  Lab work shows negative troponin.  D-dimer mildly elevated, obtain CTA that is negative for PE or other acute process such as infiltrate or fluid.  On reassessment, the patient remains well-appearing. EKG reassuring. Sx are not suggestive of ACS.  I discussed follow-up with PCP if symptoms continue for consideration of further work-up.  Given his normal vital signs and reassuring work-up here, I feel he is safe for discharge.  Have reviewed return precautions and he voiced understanding.  Final Clinical Impressions(s) / ED Diagnoses   Final diagnoses:  SOB (shortness of breath)  Cough    ED Discharge Orders    None       Onya Eutsler, Ambrose Finland, MD 06/25/18 2344

## 2018-06-25 NOTE — ED Notes (Signed)
Patient transported to CT 

## 2018-06-25 NOTE — ED Notes (Signed)
At registration pts oxygen sat is 98% with a heartrate of 93. His color is pale.

## 2018-06-25 NOTE — ED Triage Notes (Addendum)
Pt dx with the flu x 2 weeks ago. Pt having ShOB, lightheaded, and states his "lungs have been hurting." Pt using albuterol MDI without relief. Pt noted to be mildly labored in triage, but able to speak in complete sentences between breaths. Pt also stating he has a chest pressure.

## 2018-09-08 ENCOUNTER — Encounter (HOSPITAL_COMMUNITY): Payer: Self-pay | Admitting: Internal Medicine

## 2018-09-08 ENCOUNTER — Emergency Department (HOSPITAL_COMMUNITY)
Admission: EM | Admit: 2018-09-08 | Discharge: 2018-09-08 | Disposition: A | Discharge status: Home / Self Care | Payer: 59 | Attending: Emergency Medicine | Admitting: Emergency Medicine

## 2018-09-08 DIAGNOSIS — Z87891 Personal history of nicotine dependence: Secondary | ICD-10-CM | POA: Insufficient documentation

## 2018-09-08 DIAGNOSIS — J069 Acute upper respiratory infection, unspecified: Secondary | ICD-10-CM | POA: Insufficient documentation

## 2018-09-08 DIAGNOSIS — R0981 Nasal congestion: Secondary | ICD-10-CM | POA: Diagnosis present

## 2018-09-08 DIAGNOSIS — Z79899 Other long term (current) drug therapy: Secondary | ICD-10-CM | POA: Insufficient documentation

## 2018-09-08 NOTE — ED Notes (Signed)
Declined W/C at D/C and was escorted to lobby by RN. 

## 2018-09-08 NOTE — Discharge Instructions (Addendum)
Please read attached information. If you experience any new or worsening signs or symptoms please return to the emergency room for evaluation. Please follow-up with your primary care provider or specialist as discussed.  °

## 2018-09-08 NOTE — ED Provider Notes (Signed)
[facility] EMERGENCY DEPARTMENT Provider Note   CSN: [id number] Arrival date & time: 09/08/18  3887   History   Chief Complaint No chief complaint on file.   HPI Frank Soto is a 48 y.o. male.     HPI   48 year old male presents today with complaints of respiratory infection.  Patient notes approximately 2 days ago he developed rhinorrhea nasal congestion cough and body aches.  Patient denies any subjective fever at home.  He notes taking DayQuil shortly prior to my evaluation.  He denies any significant shortness of breath.  He denies any history of asthma or smoking.  No other significant past medical history.  He notes a coworker is sick with similar symptoms.  He did receive the flu vaccine.    Past Medical History:  Diagnosis Date  . Arthritis    "chest" (03/19/2018)  . Chronic pancreatitis (HCC)   . Costochondritis   . Depression   . GERD (gastroesophageal reflux disease)   . History of kidney stones   . Hyperlipidemia   . Irritable bowel syndrome (IBS) 2012  . Migraine    "a few/year" (03/19/2018)  . Peptic ulcer disease   . PTSD (post-traumatic stress disorder)     Patient Active Problem List   Diagnosis Date Noted  . Left leg injury 12/27/2013  . Dyslipidemia 10/21/2013  . HLD (hyperlipidemia) 09/26/2013  . Shortness of breath 09/23/2013  . Musculoskeletal chest pain 09/23/2013  . Atelectasis 09/23/2013  . Pre-syncope 09/23/2013  . Migraine 12/15/2012    Past Surgical History:  Procedure Laterality Date  . APPENDECTOMY    . CHOLECYSTECTOMY    . INGUINAL HERNIA REPAIR Left 1971  . KNEE ARTHROSCOPY Right 2000   "took piece of cartilage out of it"  . LEFT HEART CATHETERIZATION WITH CORONARY ANGIOGRAM N/A 09/27/2013   Procedure: LEFT HEART CATHETERIZATION WITH CORONARY ANGIOGRAM;  Surgeon: Peter M Swaziland, MD;  Location: [facility] CATH LAB;  Service: Cardiovascular;  Laterality: N/A;        Home Medications    Prior to Admission  medications   Medication Sig Start Date End Date Taking? Authorizing Provider  ALPRAZolam (XANAX PO) Take by mouth.    [provider]  busPIRone (BUSPAR) 15 MG tablet Take 15 mg by mouth 2 (two) times daily. 02/22/18   [provider]  QUEtiapine (SEROQUEL) 100 MG tablet Take 100 mg by mouth at bedtime. 02/22/18   [provider]  simvastatin (ZOCOR) 40 MG tablet Take 40 mg by mouth every morning. 02/12/18   [provider]  traZODone (DESYREL) 150 MG tablet Take 150 mg by mouth at bedtime. 03/18/18   [provider]    Family History Family History  Problem Relation Age of Onset  . COPD Mother   . Diabetes Mellitus I Father     Social History Social History   Tobacco Use  . Smoking status: Former Smoker    Packs/day: 3.00    Years: 8.00    Pack years: 24.00    Types: Cigarettes  . Smokeless tobacco: Never Used  . Tobacco comment: 03/19/2018 'quit smoking cigarettes in 1986"  Substance Use Topics  . Alcohol use: No    Comment: 03/19/2018 "nothing since 2001"  . Drug use: Not Currently    Comment: 03/19/2018 "nothing since 2001"     Allergies   Patient has no known allergies.   Review of Systems Review of Systems  All other systems reviewed and are negative.   Physical  Exam Updated Vital Signs BP (!) 132/93 (BP Location: Right Arm)   Pulse 92   Temp 98 F (36.7 C) (Oral)   Resp 16   Ht 5\' 11"  (1.803 m)   Wt 95.7 kg   SpO2 96%   BMI 29.43 kg/m   Physical Exam Vitals signs and nursing note reviewed.  Constitutional:      Appearance: He is well-developed.  HENT:     Head: Normocephalic and atraumatic.     Comments: Bilateral cerumen impaction -oropharynx clear no erythema exudate or swelling-rhinorrhea noted Eyes:     General: No scleral icterus.       Right eye: No discharge.        Left eye: No discharge.     Conjunctiva/sclera: Conjunctivae normal.     Pupils: Pupils are equal, round, and reactive to light.  Neck:       Musculoskeletal: Normal range of motion.     Vascular: No JVD.     Trachea: No tracheal deviation.  Pulmonary:     Effort: Pulmonary effort is normal. No respiratory distress.     Breath sounds: No stridor. No wheezing, rhonchi or rales.  Neurological:     Mental Status: He is alert and oriented to person, place, and time.     Coordination: Coordination normal.  Psychiatric:        Behavior: Behavior normal.        Thought Content: Thought content normal.        Judgment: Judgment normal.      ED Treatments / Results  Labs (all labs ordered are listed, but only abnormal results are displayed) Labs Reviewed - No data to display  EKG None  Radiology No results found.  Procedures Procedures (including critical care time)  Medications Ordered in ED Medications - No data to display   Initial Impression / Assessment and Plan / ED Course  I have reviewed the triage vital signs and the nursing notes.  Pertinent labs & imaging results that were available during my care of the patient were reviewed by me and considered in my medical decision making (see chart for details).        48 year old male presents today with likely viral URI.  No signs of bacterial infection.  Low suspicion for influenza.  Discharged with symptomatic care and strict return precautions.  Final Clinical Impressions(s) / ED Diagnoses   Final diagnoses:  Viral URI    ED Discharge Orders    None       Eyvonne Mechanic, PA-C 09/08/18 0908    Rolan Bucco, MD 09/08/18 [phone]

## 2018-09-08 NOTE — ED Triage Notes (Signed)
Pt here for evaluation of flu like symptoms. States generalized body aches began Sunday night (2 days ago) and productive cough and sore throat began yesterday. Denies nausea/vomiting. Reports dayquil administration at home at 0630 today.

## 2019-02-01 ENCOUNTER — Other Ambulatory Visit: Payer: Self-pay

## 2019-02-01 DIAGNOSIS — Z20822 Contact with and (suspected) exposure to covid-19: Secondary | ICD-10-CM

## 2019-02-01 DIAGNOSIS — Z20828 Contact with and (suspected) exposure to other viral communicable diseases: Secondary | ICD-10-CM

## 2019-02-03 ENCOUNTER — Emergency Department (HOSPITAL_COMMUNITY): Payer: 59

## 2019-02-03 ENCOUNTER — Emergency Department (HOSPITAL_COMMUNITY)
Admission: EM | Admit: 2019-02-03 | Discharge: 2019-02-03 | Disposition: A | Discharge status: Home / Self Care | Payer: 59 | Attending: Emergency Medicine | Admitting: Emergency Medicine

## 2019-02-03 ENCOUNTER — Other Ambulatory Visit: Payer: Self-pay

## 2019-02-03 DIAGNOSIS — Z20828 Contact with and (suspected) exposure to other viral communicable diseases: Secondary | ICD-10-CM | POA: Insufficient documentation

## 2019-02-03 DIAGNOSIS — Z87891 Personal history of nicotine dependence: Secondary | ICD-10-CM | POA: Diagnosis not present

## 2019-02-03 DIAGNOSIS — R630 Anorexia: Secondary | ICD-10-CM | POA: Insufficient documentation

## 2019-02-03 DIAGNOSIS — R0602 Shortness of breath: Secondary | ICD-10-CM | POA: Diagnosis not present

## 2019-02-03 DIAGNOSIS — R05 Cough: Secondary | ICD-10-CM | POA: Diagnosis not present

## 2019-02-03 DIAGNOSIS — R509 Fever, unspecified: Secondary | ICD-10-CM | POA: Insufficient documentation

## 2019-02-03 DIAGNOSIS — Z20822 Contact with and (suspected) exposure to covid-19: Secondary | ICD-10-CM

## 2019-02-03 LAB — BASIC METABOLIC PANEL
Anion gap: 12 (ref 5–15)
BUN: 14 mg/dL (ref 6–20)
CO2: 22 mmol/L (ref 22–32)
Calcium: 9.1 mg/dL (ref 8.9–10.3)
Chloride: 105 mmol/L (ref 98–111)
Creatinine, Ser: 1.09 mg/dL (ref 0.61–1.24)
GFR calc Af Amer: >60 mL/min (ref >60)
GFR calc non Af Amer: >60 mL/min (ref >60)
Glucose, Bld: 95 mg/dL (ref 70–99)
Potassium: 3.7 mmol/L (ref 3.5–5.1)
Sodium: 139 mmol/L (ref 135–145)

## 2019-02-03 LAB — CBC
HCT: 52.6 % — ABNORMAL HIGH (ref 39.0–52.0)
Hemoglobin: 18 g/dL — ABNORMAL HIGH (ref 13.0–17.0)
MCH: 28.7 pg (ref 26.0–34.0)
MCHC: 34.2 g/dL (ref 30.0–36.0)
MCV: 83.8 fL (ref 80.0–100.0)
Platelets: 198 10*3/uL (ref 150–400)
RBC: 6.28 MIL/uL — ABNORMAL HIGH (ref 4.22–5.81)
RDW: 12.9 % (ref 11.5–15.5)
WBC: 5.9 10*3/uL (ref 4.0–10.5)
nRBC: 0 % (ref 0.0–0.2)

## 2019-02-03 LAB — TROPONIN I (HIGH SENSITIVITY): Troponin I (High Sensitivity): <2 ng/L (ref <18)

## 2019-02-03 MED ORDER — ONDANSETRON 4 MG PO TBDP: 4.0000 mg | ORAL_TABLET | Freq: Three times a day (TID) | ORAL | 0 refills | Status: AC | PRN

## 2019-02-03 MED ORDER — AZITHROMYCIN 250 MG PO TABS: ORAL_TABLET | ORAL | 0 refills | Status: AC

## 2019-02-03 MED ORDER — SODIUM CHLORIDE 0.9% FLUSH: 3.0000 mL | Freq: Once | INTRAVENOUS | Status: DC

## 2019-02-03 MED ORDER — SODIUM CHLORIDE 0.9 % IV BOLUS
1000.0000 mL | Freq: Once | INTRAVENOUS | Status: AC
  Administered 2019-02-03: 1000 mL via INTRAVENOUS

## 2019-02-03 MED ORDER — ONDANSETRON HCL 4 MG/2ML IJ SOLN
4.0000 mg | Freq: Once | INTRAMUSCULAR | Status: AC
  Administered 2019-02-03: 4 mg via INTRAVENOUS
  Filled 2019-02-03: qty 2

## 2019-02-03 NOTE — ED Provider Notes (Signed)
[facility] EMERGENCY DEPARTMENT Provider Note   CSN: [id number] Arrival date & time: 02/03/19  1612    History   Chief Complaint Chief Complaint  Patient presents with  . Cough  . Shortness of Breath    HPI Frank Soto is a 48 y.o. male has medical history of chronic pancreatitis, GERD, IBS, PUD, PTSD presents emergency department today with chief complaint of shortness of breath x4 days.  Patient states his family was tested for COVID x2 days ago.  He does not yet have his result but his wife tested positive.  He is also reporting cough, fever, diarrhea, decreased appetite, nausea and generalized weakness.  He states he has decreased p.o. intake and lack of appetite.  He has been taking Tylenol for his symptoms with minimal relief.  He reports T-max of 101 yesterday. He denies neck pain, urinary symptoms, vomiting, wound, rash.  History provided by patient with additional history obtained from chart review.     Past Medical History:  Diagnosis Date  . Arthritis    "chest" (03/19/2018)  . Chronic pancreatitis ([place])   . Costochondritis   . Depression   . GERD (gastroesophageal reflux disease)   . History of kidney stones   . Hyperlipidemia   . Irritable bowel syndrome (IBS) 2012  . Migraine    "a few/year" (03/19/2018)  . Peptic ulcer disease   . PTSD (post-traumatic stress disorder)     Patient Active Problem List   Diagnosis Date Noted  . Left leg injury 12/27/2013  . Dyslipidemia 10/21/2013  . HLD (hyperlipidemia) 09/26/2013  . Shortness of breath 09/23/2013  . Musculoskeletal chest pain 09/23/2013  . Atelectasis 09/23/2013  . Pre-syncope 09/23/2013  . Migraine 12/15/2012    Past Surgical History:  Procedure Laterality Date  . APPENDECTOMY    . CHOLECYSTECTOMY    . INGUINAL HERNIA REPAIR Left 1971  . KNEE ARTHROSCOPY Right 2000   "took piece of cartilage out of it"  . LEFT HEART CATHETERIZATION WITH CORONARY ANGIOGRAM N/A 09/27/2013   Procedure: LEFT HEART CATHETERIZATION WITH CORONARY ANGIOGRAM;  Surgeon: Peter M Martinique, MD;  Location: [facility] CATH LAB;  Service: Cardiovascular;  Laterality: N/A;        Home Medications    Prior to Admission medications   Medication Sig Start Date End Date Taking? Authorizing Provider  ALPRAZolam (XANAX PO) Take by mouth.    [provider]  azithromycin (ZITHROMAX Z-PAK) 250 MG tablet Take 2 tablets (500 mg total) by mouth daily for 1 day, THEN 1 tablet (250 mg total) daily for 4 days. 02/03/19 02/08/19  Albrizze, Kaitlyn E, PA-C  busPIRone (BUSPAR) 15 MG tablet Take 15 mg by mouth 2 (two) times daily. 02/22/18   [provider]  QUEtiapine (SEROQUEL) 100 MG tablet Take 100 mg by mouth at bedtime. 02/22/18   [provider]  simvastatin (ZOCOR) 40 MG tablet Take 40 mg by mouth every morning. 02/12/18   [provider]  traZODone (DESYREL) 150 MG tablet Take 150 mg by mouth at bedtime. 03/18/18   [provider]    Family History Family History  Problem Relation Age of Onset  . COPD Mother   . Diabetes Mellitus I Father     Social History Social History   Tobacco Use  . Smoking status: Former Smoker    Packs/day: 3.00    Years: 8.00    Pack years: 24.00    Types: Cigarettes  . Smokeless tobacco: Never Used  .  Tobacco comment: 03/19/2018 'quit smoking cigarettes in 1986"  Substance Use Topics  . Alcohol use: No    Comment: 03/19/2018 "nothing since 2001"  . Drug use: Not Currently    Comment: 03/19/2018 "nothing since 2001"     Allergies   Patient has no known allergies.   Review of Systems Review of Systems  Constitutional: Positive for appetite change and fever. Negative for chills.  HENT: Positive for congestion. Negative for rhinorrhea, sinus pressure and sore throat.   Eyes: Negative for pain and redness.  Respiratory: Positive for cough and shortness of breath. Negative for wheezing.   Cardiovascular: Positive for chest pain.  Negative for palpitations.  Gastrointestinal: Positive for diarrhea. Negative for abdominal pain, constipation, nausea and vomiting.  Genitourinary: Negative for dysuria.  Musculoskeletal: Negative for arthralgias, back pain, myalgias and neck pain.  Skin: Negative for rash and wound.  Neurological: Negative for dizziness, syncope, weakness, numbness and headaches.  Psychiatric/Behavioral: Negative for confusion.     Physical Exam Updated Vital Signs BP 126/86 (BP Location: Right Arm)   Pulse 78   Temp 98.9 F (37.2 C) (Oral)   Resp (!) 22   SpO2 99%   Physical Exam Vitals signs and nursing note reviewed.  Constitutional:      General: He is not in acute distress.    Appearance: He is ill-appearing.  HENT:     Head: Normocephalic and atraumatic.     Comments: No sinus or temporal tenderness.    Right Ear: Tympanic membrane and external ear normal.     Left Ear: Tympanic membrane and external ear normal.     Nose: Congestion present.     Mouth/Throat:     Mouth: Mucous membranes are dry.     Pharynx: Oropharynx is clear.  Eyes:     General: No scleral icterus.       Right eye: No discharge.        Left eye: No discharge.     Extraocular Movements: Extraocular movements intact.     Conjunctiva/sclera: Conjunctivae normal.     Pupils: Pupils are equal, round, and reactive to light.  Neck:     Musculoskeletal: Normal range of motion.     Vascular: No JVD.  Cardiovascular:     Rate and Rhythm: Normal rate and regular rhythm.     Pulses: Normal pulses.          Radial pulses are 2+ on the right side and 2+ on the left side.     Heart sounds: Normal heart sounds.  Pulmonary:     Comments: Tachypneic without accessory muscle use. Pt is speaking in full sentences. Lung sounds diminished throughout, expiratory wheeze noted.  Symmetric chest rise.  SpO2 is 99% on room air. Abdominal:     Comments: Abdomen is soft, non-distended, and non-tender in all quadrants. No rigidity, no  guarding. No peritoneal signs.  Musculoskeletal: Normal range of motion.  Skin:    General: Skin is warm and dry.     Capillary Refill: Capillary refill takes less than 2 seconds.  Neurological:     Mental Status: He is oriented to person, place, and time.     GCS: GCS eye subscore is 4. GCS verbal subscore is 5. GCS motor subscore is 6.     Comments: Fluent speech, no facial droop.  Psychiatric:        Behavior: Behavior normal.      ED Treatments / Results  Labs (all labs ordered are listed, but only  abnormal results are displayed) Labs Reviewed  CBC - Abnormal; Notable for the following components:      Result Value   RBC 6.28 (*)    Hemoglobin 18.0 (*)    HCT 52.6 (*)    All other components within normal limits  BASIC METABOLIC PANEL  TROPONIN I (HIGH SENSITIVITY)    EKG EKG Interpretation  Date/Time:  Wednesday February 03 2019 16:44:10 EDT Ventricular Rate:  80 PR Interval:  136 QRS Duration: 98 QT Interval:  380 QTC Calculation: 438 R Axis:   -16 Text Interpretation:  Normal sinus rhythm Incomplete right bundle branch block Borderline ECG no change from previous Confirmed by Arby BarrettePfeiffer, Marcy [id number]) on 02/03/2019 7:26:32 PM   Radiology Dg Chest Portable 1 View  Result Date: 02/03/2019 CLINICAL DATA:  Fever, cough, abdominal pain for the past 2 days. EXAM: PORTABLE CHEST 1 VIEW COMPARISON:  CTA chest and chest x-ray dated June 25, 2018. FINDINGS: The heart size and mediastinal contours are within normal limits. Normal pulmonary vascularity. Patchy interstitial opacity at the lung bases. No pleural effusion or pneumothorax. No acute osseous abnormality. IMPRESSION: 1. Patchy interstitial opacities at the lung bases, nonspecific, but concerning for atypical infection, including potential viral pneumonia. Electronically Signed   By: Obie DredgeWilliam T Derry M.D.   On: 02/03/2019 17:45    Procedures Procedures (including critical care time)  Medications Ordered in ED  Medications  sodium chloride flush (NS) 0.9 % injection 3 mL (3 mLs Intravenous Not Given 02/03/19 1709)  sodium chloride 0.9 % bolus 1,000 mL (0 mLs Intravenous Stopped 02/03/19 1902)  ondansetron (ZOFRAN) injection 4 mg (4 mg Intravenous Given 02/03/19 1802)     Initial Impression / Assessment and Plan / ED Course  I have reviewed the triage vital signs and the nursing notes.  Pertinent labs & imaging results that were available during my care of the patient were reviewed by me and considered in my medical decision making (see chart for details).  48 yo male presents with COVID-like symptoms, with family member testing positive today. On arrival he is tachypneic.  He is afebrile, not hypoxic.  SPO2 is 99% on room air.  Looks as though he does not feel well.  Lung sounds are diminished throughout.  Chest pain is reproducible on exam. Abdomen with generalized tenderness, no peritoneal signs.   CBC with elevated RBC, hemoglobin and HCT, likely dehydration related as he has decreased po intake.  BMP unremarkable.  Troponin is negative, doubt ACS.  EKG without ischemic changes.  Chest x-ray viewed by me shows infiltrate at lung bases, this is concerning for viral pneumonia vs COVID.  Chart review shows patient COVID test however result is not yet available.  On reassessment pt feels improved after IV fluids.  His oxygen level has stayed above 96% on room air while in the ED, I feel he is stable to be discharged home with azithromycin for possible pneumonia. PT aware he needs to continue to self quarantine. Patient is comfortable with above plan. All questions were answered prior to disposition. Strict return precautions for returning to the ED were discussed. Encouraged follow up with PCP. Provided patient with a list of clinic resources to use if they do not have a PCP. Findings and plan of care discussed with supervising physician Dr. Donnald GarrePfeiffer.    Lorenza CambridgeBuddy W Poche was evaluated in Emergency  Department on 02/03/2019 for the symptoms described in the history of present illness. He was evaluated in the context of the global COVID-19  pandemic, which necessitated consideration that the patient might be at risk for infection with the SARS-CoV-2 virus that causes COVID-19. Institutional protocols and algorithms that pertain to the evaluation of patients at risk for COVID-19 are in a state of rapid change based on information released by regulatory bodies including the CDC and federal and state organizations. These policies and algorithms were followed during the patient's care in the ED.  This note was prepared using Dragon voice recognition software and may include unintentional dictation errors due to the inherent limitations of voice recognition software.   Final Clinical Impressions(s) / ED Diagnoses   Final diagnoses:  Suspected Covid-19 Virus Infection    ED Discharge Orders         Ordered    azithromycin (ZITHROMAX Z-PAK) 250 MG tablet     02/03/19 [id number]           Albrizze, Kaitlyn E, PA-C 02/03/19 1937    Arby BarrettePfeiffer, Marcy, MD 02/04/19 1655

## 2019-02-03 NOTE — ED Notes (Signed)
All appropriate discharge materials reviewed with patient at length. Time for questions provided. Pt denies any further questions at this time. Verbalizes understanding of all provided materials.  

## 2019-02-03 NOTE — ED Triage Notes (Signed)
Pt reports being tested for COVID on Monday with no result. Pt's wife resulted positive. Pt reports cough, fever, SOB, and diarrhea.

## 2019-02-03 NOTE — Discharge Instructions (Addendum)
You have been seen today for covid like symptoms. Please read and follow all provided instructions. Return to the emergency room for worsening condition or new concerning symptoms.    Your chest xray today shows possible pneumonia. Your coronavirus test is still pending. Until you have the result you should continue to self quarantine.  It is important to monitor your oxygen level, especially when you are feeling short of breath. You should be above 93%. You can buy a portable pulse ox meter to check your oxygen levels.  1. Medications:  Prescription to your pharmacy for azithromycin.  Please take this as prescribed.  This is an antibiotic to treat your possible pneumonia. - You can take tylenol as needed for fever and body aches. Please take as directed on the bottle. Continue usual home medications  2. Treatment: rest, drink plenty of fluids  3. Follow Up: Please follow up with your primary doctor in 2-5 days for discussion of your diagnoses and further evaluation after today's visit; Call today to arrange your follow up.  If you do not have a primary care doctor use the resource guide provided to find one;   It is also a possibility that you have an allergic reaction to any of the medicines that you have been prescribed - Everybody reacts differently to medications and while MOST people have no trouble with most medicines, you may have a reaction such as nausea, vomiting, rash, swelling, shortness of breath. If this is the case, please stop taking the medicine immediately and contact your physician.  ?

## 2019-02-04 LAB — NOVEL CORONAVIRUS, NAA: SARS-CoV-2, NAA: DETECTED — AB

## 2019-09-19 ENCOUNTER — Ambulatory Visit: Payer: 59 | Attending: Internal Medicine

## 2019-09-19 DIAGNOSIS — Z23 Encounter for immunization: Secondary | ICD-10-CM | POA: Insufficient documentation

## 2019-09-19 NOTE — Progress Notes (Signed)
   Covid-19 Vaccination Clinic  Name:  Frank Soto    MRN: [id number] DOB: 1971/04/01  09/19/2019  Mr. Mcgibbon was observed post Covid-19 immunization for 15 minutes without incident. He was provided with Vaccine Information Sheet and instruction to access the V-Safe system.   Mr. Schreur was instructed to call 911 with any severe reactions post vaccine: Marland Kitchen Difficulty breathing  . Swelling of face and throat  . A fast heartbeat  . A bad rash all over body  . Dizziness and weakness   Immunizations Administered    Name Date Dose VIS Date Route   Pfizer COVID-19 Vaccine 09/19/2019  5:37 PM 0.3 mL 06/25/2019 Intramuscular   Manufacturer: [employer], [employer]   Lot: [id number]   NDC: [address]-9

## 2019-10-19 ENCOUNTER — Ambulatory Visit: Payer: 59 | Attending: Internal Medicine

## 2019-10-19 DIAGNOSIS — Z23 Encounter for immunization: Secondary | ICD-10-CM

## 2019-10-19 NOTE — Progress Notes (Signed)
   Covid-19 Vaccination Clinic  Name:  ROXY FILLER    MRN: [id number] DOB: 05/14/1971  10/19/2019  Mr. Mandich was observed post Covid-19 immunization for 15 minutes without incident. He was provided with Vaccine Information Sheet and instruction to access the V-Safe system.   Mr. Jeansonne was instructed to call 911 with any severe reactions post vaccine: Marland Kitchen Difficulty breathing  . Swelling of face and throat  . A fast heartbeat  . A bad rash all over body  . Dizziness and weakness   Immunizations Administered    Name Date Dose VIS Date Route   Pfizer COVID-19 Vaccine 10/19/2019  3:05 PM 0.3 mL 06/25/2019 Intramuscular   Manufacturer: [employer], [employer]   Lot: [id number]   NDC: [address]-9

## 2019-12-25 ENCOUNTER — Encounter (HOSPITAL_COMMUNITY): Payer: Self-pay

## 2019-12-25 ENCOUNTER — Emergency Department (HOSPITAL_COMMUNITY): Payer: 59

## 2019-12-25 ENCOUNTER — Emergency Department (HOSPITAL_COMMUNITY)
Admission: EM | Admit: 2019-12-25 | Discharge: 2019-12-26 | Disposition: A | Discharge status: Home / Self Care | Payer: 59 | Attending: Emergency Medicine | Admitting: Emergency Medicine

## 2019-12-25 ENCOUNTER — Other Ambulatory Visit: Payer: Self-pay

## 2019-12-25 DIAGNOSIS — M5441 Lumbago with sciatica, right side: Secondary | ICD-10-CM | POA: Diagnosis not present

## 2019-12-25 DIAGNOSIS — Z87891 Personal history of nicotine dependence: Secondary | ICD-10-CM | POA: Diagnosis not present

## 2019-12-25 DIAGNOSIS — R509 Fever, unspecified: Secondary | ICD-10-CM | POA: Insufficient documentation

## 2019-12-25 DIAGNOSIS — M545 Low back pain: Secondary | ICD-10-CM | POA: Diagnosis present

## 2019-12-25 DIAGNOSIS — Z79899 Other long term (current) drug therapy: Secondary | ICD-10-CM | POA: Insufficient documentation

## 2019-12-25 LAB — URINALYSIS, ROUTINE W REFLEX MICROSCOPIC
Bilirubin Urine: NEGATIVE
Glucose, UA: NEGATIVE mg/dL
Hgb urine dipstick: NEGATIVE
Ketones, ur: NEGATIVE mg/dL
Leukocytes,Ua: NEGATIVE
Nitrite: NEGATIVE
Protein, ur: NEGATIVE mg/dL
Specific Gravity, Urine: 1.025 (ref 1.005–1.030)
pH: 5 (ref 5.0–8.0)

## 2019-12-25 LAB — C-REACTIVE PROTEIN: CRP: 3.4 mg/dL — ABNORMAL HIGH (ref <1.0)

## 2019-12-25 LAB — COMPREHENSIVE METABOLIC PANEL
ALT: 42 U/L (ref 0–44)
AST: 26 U/L (ref 15–41)
Albumin: 4.1 g/dL (ref 3.5–5.0)
Alkaline Phosphatase: 75 U/L (ref 38–126)
Anion gap: 13 (ref 5–15)
BUN: 17 mg/dL (ref 6–20)
CO2: 21 mmol/L — ABNORMAL LOW (ref 22–32)
Calcium: 8.9 mg/dL (ref 8.9–10.3)
Chloride: 105 mmol/L (ref 98–111)
Creatinine, Ser: 1.06 mg/dL (ref 0.61–1.24)
GFR calc Af Amer: >60 mL/min (ref >60)
GFR calc non Af Amer: >60 mL/min (ref >60)
Glucose, Bld: 141 mg/dL — ABNORMAL HIGH (ref 70–99)
Potassium: 4 mmol/L (ref 3.5–5.1)
Sodium: 139 mmol/L (ref 135–145)
Total Bilirubin: 1.7 mg/dL — ABNORMAL HIGH (ref 0.3–1.2)
Total Protein: 7 g/dL (ref 6.5–8.1)

## 2019-12-25 LAB — CBC WITH DIFFERENTIAL/PLATELET
Abs Immature Granulocytes: 0.12 10*3/uL — ABNORMAL HIGH (ref 0.00–0.07)
Basophils Absolute: 0 10*3/uL (ref 0.0–0.1)
Basophils Relative: 0 %
Eosinophils Absolute: 0 10*3/uL (ref 0.0–0.5)
Eosinophils Relative: 0 %
HCT: 45.1 % (ref 39.0–52.0)
Hemoglobin: 16.3 g/dL (ref 13.0–17.0)
Immature Granulocytes: 1 %
Lymphocytes Relative: 7 %
Lymphs Abs: 1.3 10*3/uL (ref 0.7–4.0)
MCH: 29.9 pg (ref 26.0–34.0)
MCHC: 36.1 g/dL — ABNORMAL HIGH (ref 30.0–36.0)
MCV: 82.8 fL (ref 80.0–100.0)
Monocytes Absolute: 1.5 10*3/uL — ABNORMAL HIGH (ref 0.1–1.0)
Monocytes Relative: 8 %
Neutro Abs: 16.5 10*3/uL — ABNORMAL HIGH (ref 1.7–7.7)
Neutrophils Relative %: 84 %
Platelets: 244 10*3/uL (ref 150–400)
RBC: 5.45 MIL/uL (ref 4.22–5.81)
RDW: 12.6 % (ref 11.5–15.5)
WBC: 19.4 10*3/uL — ABNORMAL HIGH (ref 4.0–10.5)
nRBC: 0 % (ref 0.0–0.2)

## 2019-12-25 LAB — SEDIMENTATION RATE: Sed Rate: 0 mm/hr (ref 0–16)

## 2019-12-25 MED ORDER — ACETAMINOPHEN 325 MG PO TABS
650.0000 mg | ORAL_TABLET | Freq: Once | ORAL | Status: AC | PRN
  Administered 2019-12-25: 650 mg via ORAL
  Filled 2019-12-25: qty 2

## 2019-12-25 MED ORDER — SODIUM CHLORIDE (PF) 0.9 % IJ SOLN
INTRAMUSCULAR | Status: AC
  Filled 2019-12-25: qty 50

## 2019-12-25 MED ORDER — IOHEXOL 300 MG/ML  SOLN
100.0000 mL | Freq: Once | INTRAMUSCULAR | Status: AC | PRN
  Administered 2019-12-25: 100 mL via INTRAVENOUS

## 2019-12-25 MED ORDER — HYDROMORPHONE HCL 1 MG/ML IJ SOLN
1.0000 mg | Freq: Once | INTRAMUSCULAR | Status: AC
  Administered 2019-12-25: 1 mg via INTRAVENOUS
  Filled 2019-12-25: qty 1

## 2019-12-25 MED ORDER — ONDANSETRON HCL 4 MG/2ML IJ SOLN
4.0000 mg | Freq: Once | INTRAMUSCULAR | Status: AC
  Administered 2019-12-25: 4 mg via INTRAVENOUS
  Filled 2019-12-25: qty 2

## 2019-12-25 NOTE — ED Triage Notes (Signed)
Patient  c/o right lower back pain that radiates down is right leg and up to the right shoulder that started earlier today.

## 2019-12-25 NOTE — ED Provider Notes (Signed)
[facility]-EMERGENCY DEPT Provider Note   CSN: [id number] Arrival date & time: 12/25/19  1804     History Chief Complaint  Patient presents with  . Back Pain  . Fever    Frank Soto is a 49 y.o. male.  HPI   49yM with back pain. Lower back. Onset this morning and worsening through the day. Denies trauma or strain. Radiates into R leg. Worse with movement. Subjective fever. Denies hx of significant back problems. No urinary complaints. No hx of back surgery. Not anticoagulated. Denies Hx of IVDU.   Past Medical History:  Diagnosis Date  . Arthritis    "chest" (03/19/2018)  . Chronic pancreatitis (HCC)   . Costochondritis   . Depression   . GERD (gastroesophageal reflux disease)   . History of kidney stones   . Hyperlipidemia   . Irritable bowel syndrome (IBS) 2012  . Migraine    "a few/year" (03/19/2018)  . Peptic ulcer disease   . PTSD (post-traumatic stress disorder)    Patient Active Problem List   Diagnosis Date Noted  . Left leg injury 12/27/2013  . Dyslipidemia 10/21/2013  . HLD (hyperlipidemia) 09/26/2013  . Shortness of breath 09/23/2013  . Musculoskeletal chest pain 09/23/2013  . Atelectasis 09/23/2013  . Pre-syncope 09/23/2013  . Migraine 12/15/2012   Past Surgical History:  Procedure Laterality Date  . APPENDECTOMY    . CHOLECYSTECTOMY    . INGUINAL HERNIA REPAIR Left 1971  . KNEE ARTHROSCOPY Right 2000   "took piece of cartilage out of it"  . LEFT HEART CATHETERIZATION WITH CORONARY ANGIOGRAM N/A 09/27/2013   Procedure: LEFT HEART CATHETERIZATION WITH CORONARY ANGIOGRAM;  Surgeon: Peter M Swaz[place]and, MD;  Location: [facility] CATH LAB;  Service: Cardiovascular;  Laterality: N/A;      Family History  Problem Relation Age of Onset  . COPD Mother   . Diabetes Mellitus I Father     Social History   Tobacco Use  . Smoking status: Former Smoker    Packs/day: 3.00    Years: 8.00    Pack years: 24.00    Types: Cigarettes  .  Smokeless tobacco: Never Used  . Tobacco comment: 03/19/2018 'quit smoking cigarettes in 1986"  Vaping Use  . Vaping Use: Never used  Substance Use Topics  . Alcohol use: No    Comment: 03/19/2018 "nothing since 2001"  . Drug use: Not Currently    Comment: 03/19/2018 "nothing since 2001"    Home Medications Prior to Admission medications   Medication Sig Start Date End Date Taking? Authorizing Provider  albuterol (VENTOLIN HFA) 108 (90 Base) MCG/ACT inhaler Inhale 2 puffs into the lungs every 6 (six) hours as needed for wheezing or shortness of breath.   Yes [provider]  traZODone (DESYREL) 150 MG tablet Take 150 mg by mouth at bedtime. 03/18/18  Yes [provider]  ALPRAZolam (XANAX PO) Take by mouth. Patient not taking: Reported on 12/25/2019    [provider]  ondansetron (ZOFRAN ODT) 4 MG disintegrating tablet Take 1 tablet (4 mg total) by mouth every 8 (eight) hours as needed for nausea or vomiting. Patient not taking: Reported on 12/25/2019 02/03/19   Albrizze, Caroleen Hamman, PA-C    Allergies    Patient has no known allergies.  Review of Systems   Review of Systems   All systems reviewed and negative, other than as noted in HPI.  Physical Exam Updated Vital Signs BP (!) 159/96   Pulse (!) 111  Temp 99.9 F (37.7 C) (Oral)   Resp 18   Ht 6' (1.829 m)   Wt 101.2 kg   SpO2 96%   BMI 30.24 kg/m   Physical Exam Vitals and nursing note reviewed.  Constitutional:      General: He is not in acute distress.    Appearance: He is well-developed.  HENT:     Head: Normocephalic and atraumatic.  Eyes:     General:        Right eye: No discharge.        Left eye: No discharge.     Conjunctiva/sclera: Conjunctivae normal.  Cardiovascular:     Rate and Rhythm: Normal rate and regular rhythm.     Heart sounds: Normal heart sounds. No murmur heard.  No friction rub. No gallop.   Pulmonary:     Effort: Pulmonary effort is normal. No respiratory  distress.     Breath sounds: Normal breath sounds.  Abdominal:     General: There is no distension.     Palpations: Abdomen is soft.     Tenderness: There is abdominal tenderness.     Comments: LLQ tenderness  Musculoskeletal:        General: No tenderness.     Cervical back: Neck supple.     Comments: Pain in lower back when sitting up in bed but not reproducible on palpation. Increased pain in lower pain with R hip extension.   Skin:    General: Skin is warm and dry.  Neurological:     Mental Status: He is alert.  Psychiatric:        Behavior: Behavior normal.        Thought Content: Thought content normal.     ED Results / Procedures / Treatments   Labs (all labs ordered are listed, but only abnormal results are displayed) Labs Reviewed  C-REACTIVE PROTEIN - Abnormal; Notable for the following components:      Result Value   CRP 3.4 (*)    All other components within normal limits  COMPREHENSIVE METABOLIC PANEL - Abnormal; Notable for the following components:   CO2 21 (*)    Glucose, Bld 141 (*)    Total Bilirubin 1.7 (*)    All other components within normal limits  CBC WITH DIFFERENTIAL/PLATELET - Abnormal; Notable for the following components:   WBC 19.4 (*)    MCHC 36.1 (*)    Neutro Abs 16.5 (*)    Monocytes Absolute 1.5 (*)    Abs Immature Granulocytes 0.12 (*)    All other components within normal limits  CULTURE, BLOOD (ROUTINE X 2)  CULTURE, BLOOD (ROUTINE X 2)  URINALYSIS, ROUTINE W REFLEX MICROSCOPIC  SEDIMENTATION RATE  LIPASE, BLOOD    EKG None  Radiology DG Eye Foreign Body  Result Date: 12/26/2019 CLINICAL DATA:  Metal working/exposure; clearance prior to MRI EXAM: ORBITS FOR FOREIGN BODY - 2 VIEW COMPARISON:  None. FINDINGS: There is no evidence of metallic foreign body within the orbits. No significant bone abnormality identified. IMPRESSION: No evidence of metallic foreign body within the orbits. Electronically Signed   By: Lauralyn Primes M.D.    On: 12/26/2019 10:41   MR THORACIC SPINE WO CONTRAST  Result Date: 12/26/2019 CLINICAL DATA:  Right-sided back pain.  Fevers. EXAM: MRI THORACIC SPINE WITHOUT CONTRAST TECHNIQUE: Multiplanar, multisequence MR imaging of the thoracic spine was performed. No intravenous contrast was administered. COMPARISON:  CTA chest dated June 25, 2018. FINDINGS: Alignment:  Physiologic. Vertebrae: No fracture, evidence  of discitis, or bone lesion. Small Schmorl's node involving the T3 superior endplate. Cord:  Normal signal and morphology. Paraspinal and other soft tissues: Subsegmental atelectasis in both lower lobes. Otherwise negative. Disc levels: No significant disc bulge or herniation.  No stenosis. IMPRESSION: 1. No acute abnormality or significant degenerative changes of the thoracic spine. Electronically Signed   By: Titus Dubin M.D.   On: 12/26/2019 11:32   MR LUMBAR SPINE WO CONTRAST  Result Date: 12/26/2019 CLINICAL DATA:  Right-sided back pain radiating into the right leg. Fever and leukocytosis. EXAM: MRI LUMBAR SPINE WITHOUT CONTRAST TECHNIQUE: Multiplanar, multisequence MR imaging of the lumbar spine was performed. No intravenous contrast was administered. COMPARISON:  CT abdomen pelvis from yesterday. FINDINGS: Segmentation:  Standard. Alignment:  Physiologic. Vertebrae:  No fracture, evidence of discitis, or bone lesion. Conus medullaris and cauda equina: Conus extends to the L1 level. Conus and cauda equina appear normal. Paraspinal and other soft tissues: Negative. Disc levels: T12-L1:  Negative. L1-L2:  Negative. L2-L3:  Negative. L3-L4:  Minimal disc desiccation and bulging.  No stenosis. L4-L5:  Negative. L5-S1:  Negative. IMPRESSION: 1. No acute abnormality. 2. Minimal degenerative disc disease at L3-L4. No stenosis or impingement. Electronically Signed   By: Titus Dubin M.D.   On: 12/26/2019 12:15   CT ABDOMEN PELVIS W CONTRAST  Result Date: 12/25/2019 CLINICAL DATA:  Right lower  back pain EXAM: CT ABDOMEN AND PELVIS WITH CONTRAST TECHNIQUE: Multidetector CT imaging of the abdomen and pelvis was performed using the standard protocol following bolus administration of intravenous contrast. CONTRAST:  110mL OMNIPAQUE IOHEXOL 300 MG/ML  SOLN COMPARISON:  12/16/2015 FINDINGS: Lower chest: Areas of subsegmental atelectasis in the lung bases. No effusions. Hepatobiliary: Diffuse low-density throughout the liver compatible with fatty infiltration. No focal abnormality. Gallbladder unremarkable. Pancreas: No focal abnormality or ductal dilatation. Spleen: No focal abnormality.  Normal size. Adrenals/Urinary Tract: No adrenal abnormality. No focal renal abnormality. No stones or hydronephrosis. Urinary bladder is unremarkable. Stomach/Bowel: Prior appendectomy. Stomach, large and small bowel grossly unremarkable. Vascular/Lymphatic: No evidence of aneurysm or adenopathy. Reproductive: No visible focal abnormality. Other: No free fluid or free air. Musculoskeletal: No acute bony abnormality. IMPRESSION: No acute findings in the abdomen or pelvis. Fatty infiltration of the liver. Bibasilar atelectasis. Electronically Signed   By: Rolm Baptise M.D.   On: 12/25/2019 23:51    Procedures Procedures (including critical care time)  Medications Ordered in ED Medications  ondansetron (ZOFRAN) injection 4 mg (has no administration in time range)  HYDROmorphone (DILAUDID) injection 1 mg (has no administration in time range)  acetaminophen (TYLENOL) tablet 650 mg (650 mg Oral Given 12/25/19 1825)    ED Course  I have reviewed the triage vital signs and the nursing notes.  Pertinent labs & imaging results that were available during my care of the patient were reviewed by me and considered in my medical decision making (see chart for details).    MDM Rules/Calculators/A&P                          49yM with lower back pain and fever. Tender in LLQ. Diverticulitis? Having pain into RLE though that  sounds radicular that would be unusual for diverticulitis. Consider vertebral osteomyelitis/discitis. He denies hx of IVDU to me though. No prior back surgery. Will CT but if there is no obvious explanatory pathology on he will need and MRI. Labs including inflammatory markers, blood cultures and UA.   Final Clinical  Impression(s) / ED Diagnoses Final diagnoses:  Febrile illness, acute  Acute low back pain with right-sided sciatica, unspecified back pain laterality    Rx / DC Orders ED Discharge Orders    None       Raeford Razor, MD 12/26/19 1534

## 2019-12-26 ENCOUNTER — Emergency Department (HOSPITAL_COMMUNITY): Payer: 59

## 2019-12-26 LAB — LIPASE, BLOOD: Lipase: 22 U/L (ref 11–51)

## 2019-12-26 MED ORDER — KETOROLAC TROMETHAMINE 15 MG/ML IJ SOLN
15.0000 mg | Freq: Once | INTRAMUSCULAR | Status: AC
  Administered 2019-12-26: 15 mg via INTRAVENOUS
  Filled 2019-12-26: qty 1

## 2019-12-26 MED ORDER — OXYCODONE-ACETAMINOPHEN 5-325 MG PO TABS: 1.0000 | ORAL_TABLET | ORAL | 0 refills | Status: AC | PRN

## 2019-12-26 MED ORDER — HYDROMORPHONE HCL 1 MG/ML IJ SOLN
1.0000 mg | INTRAMUSCULAR | Status: DC | PRN
  Administered 2019-12-26: 1 mg via INTRAVENOUS
  Filled 2019-12-26: qty 1

## 2019-12-26 MED ORDER — HYDROMORPHONE HCL 1 MG/ML IJ SOLN
1.0000 mg | Freq: Once | INTRAMUSCULAR | Status: AC
  Administered 2019-12-26: 1 mg via INTRAVENOUS
  Filled 2019-12-26: qty 1

## 2019-12-26 MED ORDER — MELOXICAM 15 MG PO TABS: 15.0000 mg | ORAL_TABLET | Freq: Every day | ORAL | 0 refills | Status: AC

## 2019-12-26 NOTE — ED Notes (Signed)
Patient transported to MRI 

## 2019-12-26 NOTE — ED Provider Notes (Signed)
1:43 PM  I actually initially saw Mr Frank Soto during my shift yesterday. His CT a/p didn't end up showing any acute pathology. Had subsequent MR of thoracic and lumbar spine with only mild degenerative changes. UA normal.  He says he is still feeling about the same but he has had no clinical deterioration after ~20 hours at this point.  No HA, neck pain or meningeal signs on exam. Blood cultures drawn. I don't have a clear etiology of his symptoms but nothing is readily pointing to anything serious at this time. Will DC. I detailed return precautions. Symptomatic tx otherwise.   DG Eye Foreign Body  Result Date: 12/26/2019 CLINICAL DATA:  Metal working/exposure; clearance prior to MRI EXAM: ORBITS FOR FOREIGN BODY - 2 VIEW COMPARISON:  None. FINDINGS: There is no evidence of metallic foreign body within the orbits. No significant bone abnormality identified. IMPRESSION: No evidence of metallic foreign body within the orbits. Electronically Signed   By: Eddie Candle M.D.   On: 12/26/2019 10:41   MR THORACIC SPINE WO CONTRAST  Result Date: 12/26/2019 CLINICAL DATA:  Right-sided back pain.  Fevers. EXAM: MRI THORACIC SPINE WITHOUT CONTRAST TECHNIQUE: Multiplanar, multisequence MR imaging of the thoracic spine was performed. No intravenous contrast was administered. COMPARISON:  CTA chest dated June 25, 2018. FINDINGS: Alignment:  Physiologic. Vertebrae: No fracture, evidence of discitis, or bone lesion. Small Schmorl's node involving the T3 superior endplate. Cord:  Normal signal and morphology. Paraspinal and other soft tissues: Subsegmental atelectasis in both lower lobes. Otherwise negative. Disc levels: No significant disc bulge or herniation.  No stenosis. IMPRESSION: 1. No acute abnormality or significant degenerative changes of the thoracic spine. Electronically Signed   By: Titus Dubin M.D.   On: 12/26/2019 11:32   MR LUMBAR SPINE WO CONTRAST  Result Date: 12/26/2019 CLINICAL DATA:   Right-sided back pain radiating into the right leg. Fever and leukocytosis. EXAM: MRI LUMBAR SPINE WITHOUT CONTRAST TECHNIQUE: Multiplanar, multisequence MR imaging of the lumbar spine was performed. No intravenous contrast was administered. COMPARISON:  CT abdomen pelvis from yesterday. FINDINGS: Segmentation:  Standard. Alignment:  Physiologic. Vertebrae:  No fracture, evidence of discitis, or bone lesion. Conus medullaris and cauda equina: Conus extends to the L1 level. Conus and cauda equina appear normal. Paraspinal and other soft tissues: Negative. Disc levels: T12-L1:  Negative. L1-L2:  Negative. L2-L3:  Negative. L3-L4:  Minimal disc desiccation and bulging.  No stenosis. L4-L5:  Negative. L5-S1:  Negative. IMPRESSION: 1. No acute abnormality. 2. Minimal degenerative disc disease at L3-L4. No stenosis or impingement. Electronically Signed   By: Titus Dubin M.D.   On: 12/26/2019 12:15   CT ABDOMEN PELVIS W CONTRAST  Result Date: 12/25/2019 CLINICAL DATA:  Right lower back pain EXAM: CT ABDOMEN AND PELVIS WITH CONTRAST TECHNIQUE: Multidetector CT imaging of the abdomen and pelvis was performed using the standard protocol following bolus administration of intravenous contrast. CONTRAST:  157mL OMNIPAQUE IOHEXOL 300 MG/ML  SOLN COMPARISON:  12/16/2015 FINDINGS: Lower chest: Areas of subsegmental atelectasis in the lung bases. No effusions. Hepatobiliary: Diffuse low-density throughout the liver compatible with fatty infiltration. No focal abnormality. Gallbladder unremarkable. Pancreas: No focal abnormality or ductal dilatation. Spleen: No focal abnormality.  Normal size. Adrenals/Urinary Tract: No adrenal abnormality. No focal renal abnormality. No stones or hydronephrosis. Urinary bladder is unremarkable. Stomach/Bowel: Prior appendectomy. Stomach, large and small bowel grossly unremarkable. Vascular/Lymphatic: No evidence of aneurysm or adenopathy. Reproductive: No visible focal abnormality. Other: No  free fluid or free air.  Musculoskeletal: No acute bony abnormality. IMPRESSION: No acute findings in the abdomen or pelvis. Fatty infiltration of the liver. Bibasilar atelectasis. Electronically Signed   By: Charlett Nose M.D.   On: 12/25/2019 23:51      Raeford Razor, MD 12/26/19 1352

## 2019-12-26 NOTE — ED Provider Notes (Signed)
Nursing notes and vitals signs, including pulse oximetry, reviewed.  Summary of this visit's results, reviewed by myself:  EKG:  EKG Interpretation  Date/Time:    Ventricular Rate:    PR Interval:    QRS Duration:   QT Interval:    QTC Calculation:   R Axis:     Text Interpretation:         Labs:  Results for orders placed or performed during the hospital encounter of 12/25/19 (from the past 24 hour(s))  Urinalysis, Routine w reflex microscopic- may I&O cath if menses     Status: None   Collection Time: 12/25/19  6:30 PM  Result Value Ref Range   Color, Urine YELLOW YELLOW   APPearance CLEAR CLEAR   Specific Gravity, Urine 1.025 1.005 - 1.030   pH 5.0 5.0 - 8.0   Glucose, UA NEGATIVE NEGATIVE mg/dL   Hgb urine dipstick NEGATIVE NEGATIVE   Bilirubin Urine NEGATIVE NEGATIVE   Ketones, ur NEGATIVE NEGATIVE mg/dL   Protein, ur NEGATIVE NEGATIVE mg/dL   Nitrite NEGATIVE NEGATIVE   Leukocytes,Ua NEGATIVE NEGATIVE  C-reactive protein     Status: Abnormal   Collection Time: 12/25/19  9:38 PM  Result Value Ref Range   CRP 3.4 (H) <1.0 mg/dL  Sedimentation rate     Status: None   Collection Time: 12/25/19  9:38 PM  Result Value Ref Range   Sed Rate 0 0 - 16 mm/hr  Comprehensive metabolic panel     Status: Abnormal   Collection Time: 12/25/19  9:38 PM  Result Value Ref Range   Sodium 139 135 - 145 mmol/L   Potassium 4.0 3.5 - 5.1 mmol/L   Chloride 105 98 - 111 mmol/L   CO2 21 (L) 22 - 32 mmol/L   Glucose, Bld 141 (H) 70 - 99 mg/dL   BUN 17 6 - 20 mg/dL   Creatinine, Ser 1.06 0.61 - 1.24 mg/dL   Calcium 8.9 8.9 - 10.3 mg/dL   Total Protein 7.0 6.5 - 8.1 g/dL   Albumin 4.1 3.5 - 5.0 g/dL   AST 26 15 - 41 U/L   ALT 42 0 - 44 U/L   Alkaline Phosphatase 75 38 - 126 U/L   Total Bilirubin 1.7 (H) 0.3 - 1.2 mg/dL   GFR calc non Af Amer >60 >60 mL/min   GFR calc Af Amer >60 >60 mL/min   Anion gap 13 5 - 15  CBC with Differential     Status: Abnormal   Collection Time:  12/25/19  9:38 PM  Result Value Ref Range   WBC 19.4 (H) 4.0 - 10.5 K/uL   RBC 5.45 4.22 - 5.81 MIL/uL   Hemoglobin 16.3 13.0 - 17.0 g/dL   HCT 45.1 39 - 52 %   MCV 82.8 80.0 - 100.0 fL   MCH 29.9 26.0 - 34.0 pg   MCHC 36.1 (H) 30.0 - 36.0 g/dL   RDW 12.6 11.5 - 15.5 %   Platelets 244 150 - 400 K/uL   nRBC 0.0 0.0 - 0.2 %   Neutrophils Relative % 84 %   Neutro Abs 16.5 (H) 1.7 - 7.7 K/uL   Lymphocytes Relative 7 %   Lymphs Abs 1.3 0.7 - 4.0 K/uL   Monocytes Relative 8 %   Monocytes Absolute 1.5 (H) 0 - 1 K/uL   Eosinophils Relative 0 %   Eosinophils Absolute 0.0 0 - 0 K/uL   Basophils Relative 0 %   Basophils Absolute 0.0 0 - 0  K/uL   Immature Granulocytes 1 %   Abs Immature Granulocytes 0.12 (H) 0.00 - 0.07 K/uL  Lipase, blood     Status: None   Collection Time: 12/25/19  9:38 PM  Result Value Ref Range   Lipase 22 11 - 51 U/L    Imaging Studies: CT ABDOMEN PELVIS W CONTRAST  Result Date: 12/25/2019 CLINICAL DATA:  Right lower back pain EXAM: CT ABDOMEN AND PELVIS WITH CONTRAST TECHNIQUE: Multidetector CT imaging of the abdomen and pelvis was performed using the standard protocol following bolus administration of intravenous contrast. CONTRAST:  OMNIPAQUE IOHEXOL 300 MG/ML  SOLN COMPARISON:  12/16/2015 FINDINGS: Lower chest: Areas of subsegmental atelectasis in the lung bases. No effusions. Hepatobiliary: Diffuse low-density throughout the liver compatible with fatty infiltration. No focal abnormality. Gallbladder unremarkable. Pancreas: No focal abnormality or ductal dilatation. Spleen: No focal abnormality.  Normal size. Adrenals/Urinary Tract: No adrenal abnormality. No focal renal abnormality. No stones or hydronephrosis. Urinary bladder is unremarkable. Stomach/Bowel: Prior appendectomy. Stomach, large and small bowel grossly unremarkable. Vascular/Lymphatic: No evidence of aneurysm or adenopathy. Reproductive: No visible focal abnormality. Other: No free fluid or free  air. Musculoskeletal: No acute bony abnormality. IMPRESSION: No acute findings in the abdomen or pelvis. Fatty infiltration of the liver. Bibasilar atelectasis. Electronically Signed   By: Charlett Nose M.D.   On: 12/25/2019 23:51   12:55 AM Patient advised of unremarkable CT findings.  Will obtain MRI of the L-spine with and without contrast to evaluate for possible infectious process.     Paula Libra, MD 12/26/19 2237

## 2019-12-26 NOTE — ED Notes (Signed)
Pt assisted to restroom via steady.  

## 2019-12-30 LAB — CULTURE, BLOOD (ROUTINE X 2)
Culture: NO GROWTH
Culture: NO GROWTH
Special Requests: ADEQUATE
Special Requests: ADEQUATE
